# Patient Record
Sex: Male | Born: 2000 | Race: White | Hispanic: No | Marital: Single | Smoking: Never smoker
Health system: Southern US, Community
[De-identification: ages and names within clinical notes are randomized; demographics above are authoritative.]

## PROBLEM LIST (undated history)

## (undated) DIAGNOSIS — F909 Attention-deficit hyperactivity disorder, unspecified type: Secondary | ICD-10-CM

## (undated) DIAGNOSIS — E7800 Pure hypercholesterolemia, unspecified: Secondary | ICD-10-CM

---
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
## 2020-05-09 ENCOUNTER — Other Ambulatory Visit: Payer: Self-pay

## 2020-05-09 DIAGNOSIS — Z20822 Contact with and (suspected) exposure to covid-19: Secondary | ICD-10-CM

## 2020-05-10 LAB — NOVEL CORONAVIRUS, NAA: SARS-CoV-2, NAA: NOT DETECTED

## 2020-05-10 LAB — SARS-COV-2, NAA 2 DAY TAT

## 2020-08-28 ENCOUNTER — Other Ambulatory Visit: Payer: Self-pay

## 2020-08-28 ENCOUNTER — Encounter: Payer: Self-pay | Admitting: Emergency Medicine

## 2020-08-28 ENCOUNTER — Ambulatory Visit
Admission: EM | Admit: 2020-08-28 | Discharge: 2020-08-28 | Disposition: A | Discharge status: Home / Self Care | Payer: BC Managed Care – PPO | Attending: Family Medicine | Admitting: Family Medicine

## 2020-08-28 DIAGNOSIS — J069 Acute upper respiratory infection, unspecified: Secondary | ICD-10-CM | POA: Diagnosis not present

## 2020-08-28 DIAGNOSIS — Z20822 Contact with and (suspected) exposure to covid-19: Secondary | ICD-10-CM | POA: Insufficient documentation

## 2020-08-28 DIAGNOSIS — R051 Acute cough: Secondary | ICD-10-CM | POA: Insufficient documentation

## 2020-08-28 DIAGNOSIS — R519 Headache, unspecified: Secondary | ICD-10-CM | POA: Diagnosis present

## 2020-08-28 MED ORDER — PROMETHAZINE-DM 6.25-15 MG/5ML PO SYRP: 5.0000 mL | ORAL_SOLUTION | Freq: Four times a day (QID) | ORAL | 0 refills | Status: DC | PRN

## 2020-08-28 MED ORDER — BENZONATATE 100 MG PO CAPS: 200.0000 mg | ORAL_CAPSULE | Freq: Three times a day (TID) | ORAL | 0 refills | Status: DC

## 2020-08-28 NOTE — ED Triage Notes (Signed)
Pt presents with headache, cough, wheezing, body aches x 1-2 weeks.

## 2020-08-28 NOTE — ED Provider Notes (Signed)
[facility]    CSN: [id number] Arrival date & time: 08/28/20  1901      History   Chief Complaint Chief Complaint  Patient presents with  . Headache  . Cough  . Fever    HPI Jeremy Garrett is a 20 y.o. male.   HPI   20 year old male here for evaluation of headache, fatigue, cough, shortness of breath, wheezing, runny nose, nasal congestion, diarrhea, body aches that started 1 week ago.  Patient denies ear pain, sore throat, nausea or vomiting.  Patient stop infection against Covid or flu.  History reviewed. No pertinent past medical history.  There are no problems to display for this patient.   History reviewed. No pertinent surgical history.     Home Medications    Prior to Admission medications   Medication Sig Start Date End Date Taking? Authorizing Provider  benzonatate (TESSALON) 100 MG capsule Take 2 capsules (200 mg total) by mouth every 8 (eight) hours. 08/28/20  Yes Becky Augusta, NP  promethazine-dextromethorphan (PROMETHAZINE-DM) 6.25-15 MG/5ML syrup Take 5 mLs by mouth 4 (four) times daily as needed. 08/28/20  Yes Becky Augusta, NP  hydrOXYzine (ATARAX/VISTARIL) 25 MG tablet Take 25 mg by mouth 3 (three) times daily. 08/14/20   [provider]  albuterol (PROVENTIL HFA;VENTOLIN HFA) 108 (90 BASE) MCG/ACT inhaler Inhale 2 puffs into the lungs every 6 (six) hours as needed for wheezing or shortness of breath. 05/22/15 08/28/20  Menshew, Charlesetta Ivory, PA-C    Family History History reviewed. No pertinent family history.  Social History Social History   Tobacco Use  . Smoking status: Never Smoker  . Smokeless tobacco: Current User  Substance Use Topics  . Alcohol use: No     Allergies   Patient has no known allergies.   Review of Systems Review of Systems  Constitutional: Positive for fatigue and fever. Negative for activity change and appetite change.  HENT: Positive for congestion and rhinorrhea. Negative for ear pain  and sore throat.   Respiratory: Positive for cough, shortness of breath and wheezing.   Gastrointestinal: Positive for diarrhea. Negative for nausea and vomiting.  Musculoskeletal: Positive for arthralgias and myalgias.  Neurological: Positive for headaches.  Hematological: Negative.   Psychiatric/Behavioral: Negative.      Physical Exam Triage Vital Signs ED Triage Vitals  Enc Vitals Group     BP      Pulse      Resp      Temp      Temp src      SpO2      Weight      Height      Head Circumference      Peak Flow      Pain Score      Pain Loc      Pain Edu?      Excl. in GC?    No data found.  Updated Vital Signs BP 124/64 (BP Location: Right Arm)   Pulse 68   Temp 98.2 F (36.8 C) (Oral)   Resp 16   Ht 5\' 10"  (1.778 m)   Wt 166 lb (75.3 kg)   SpO2 100%   BMI 23.82 kg/m   Visual Acuity Right Eye Distance:   Left Eye Distance:   Bilateral Distance:    Right Eye Near:   Left Eye Near:    Bilateral Near:     Physical Exam Vitals and nursing note reviewed.  Constitutional:      General: He  is not in acute distress.    Appearance: Normal appearance. He is well-developed and normal weight. He is not ill-appearing.  HENT:     Head: Normocephalic and atraumatic.     Right Ear: Tympanic membrane, ear canal and external ear normal.     Left Ear: Tympanic membrane, ear canal and external ear normal.     Nose: Congestion and rhinorrhea present.     Comments: Nasal mucosa is erythematous and edematous with scant clear nasal discharge.    Mouth/Throat:     Mouth: Mucous membranes are moist.     Pharynx: Oropharynx is clear. No posterior oropharyngeal erythema.  Cardiovascular:     Rate and Rhythm: Normal rate and regular rhythm.     Pulses: Normal pulses.     Heart sounds: Normal heart sounds. No murmur heard. No gallop.   Pulmonary:     Effort: Pulmonary effort is normal.     Breath sounds: Normal breath sounds. No wheezing, rhonchi or rales.   Musculoskeletal:     Cervical back: Normal range of motion and neck supple.  Lymphadenopathy:     Cervical: No cervical adenopathy.  Skin:    General: Skin is warm and dry.     Capillary Refill: Capillary refill takes less than 2 seconds.     Findings: No erythema.  Neurological:     General: No focal deficit present.     Mental Status: He is alert.  Psychiatric:        Mood and Affect: Mood normal.        Behavior: Behavior normal.        Thought Content: Thought content normal.        Judgment: Judgment normal.      UC Treatments / Results  Labs (all labs ordered are listed, but only abnormal results are displayed) Labs Reviewed  SARS CORONAVIRUS 2 (TAT 6-24 HRS)    EKG   Radiology No results found.  Procedures Procedures (including critical care time)  Medications Ordered in UC Medications - No data to display  Initial Impression / Assessment and Plan / UC Course  I have reviewed the triage vital signs and the nursing notes.  Pertinent labs & imaging results that were available during my care of the patient were reviewed by me and considered in my medical decision making (see chart for details).   For evaluation of Covid-like symptoms that have been going on for the past week.  Patient is in no acute distress.  Patient is unvaccinated his Covid and one of his close workmates who he takes breaks with recently tested positive for Covid.  Will send Covid swab and DC patient home to isolate pending the results.  Will give Tessalon Perles and Promethazine DM for cough and nasal congestion.   Final Clinical Impressions(s) / UC Diagnoses   Final diagnoses:  Viral URI with cough     Discharge Instructions     Isolate at home until the results of your Covid test are known.  If your test is positive you will have to quarantine for 5 days from the onset of your symptoms.  After 5 days you can break quarantine if your symptoms have improved and you have not run a  fever for 24 hours.  Take over-the-counter Tylenol and ibuprofen as needed for fever and body aches.  Use the Tessalon Perles during the day as needed for cough and the Promethazine DM at nighttime as needed for cough and congestion.  If you develop  worsening shortness of breath-especially at rest, you cannot speak in full sentences, or as a late sign your lips are turning blue you need to go to the ER for evaluation.    ED Prescriptions    Medication Sig Dispense Auth. Provider   benzonatate (TESSALON) 100 MG capsule Take 2 capsules (200 mg total) by mouth every 8 (eight) hours. 21 capsule Becky Augusta, NP   promethazine-dextromethorphan (PROMETHAZINE-DM) 6.25-15 MG/5ML syrup Take 5 mLs by mouth 4 (four) times daily as needed. 118 mL Becky Augusta, NP     PDMP not reviewed this encounter.   Becky Augusta, NP 08/28/20 1934

## 2020-08-28 NOTE — Discharge Instructions (Addendum)
Isolate at home until the results of your Covid test are known.  If your test is positive you will have to quarantine for 5 days from the onset of your symptoms.  After 5 days you can break quarantine if your symptoms have improved and you have not run a fever for 24 hours.  Take over-the-counter Tylenol and ibuprofen as needed for fever and body aches.  Use the Tessalon Perles during the day as needed for cough and the Promethazine DM at nighttime as needed for cough and congestion.  If you develop worsening shortness of breath-especially at rest, you cannot speak in full sentences, or as a late sign your lips are turning blue you need to go to the ER for evaluation.

## 2020-08-29 LAB — SARS CORONAVIRUS 2 (TAT 6-24 HRS): SARS Coronavirus 2: NEGATIVE

## 2020-09-12 ENCOUNTER — Ambulatory Visit
Admission: EM | Admit: 2020-09-12 | Discharge: 2020-09-12 | Disposition: A | Discharge status: Home / Self Care | Payer: BC Managed Care – PPO | Attending: Emergency Medicine | Admitting: Emergency Medicine

## 2020-09-12 ENCOUNTER — Other Ambulatory Visit: Payer: Self-pay

## 2020-09-12 DIAGNOSIS — J029 Acute pharyngitis, unspecified: Secondary | ICD-10-CM | POA: Insufficient documentation

## 2020-09-12 LAB — GROUP A STREP BY PCR: Group A Strep by PCR: NOT DETECTED

## 2020-09-12 NOTE — ED Provider Notes (Signed)
[facility]    CSN: [id number] Arrival date & time: 09/12/20  1902      History   Chief Complaint Chief Complaint  Patient presents with  . Sore Throat    HPI ROSHON Garrett is a 20 y.o. male.   HPI   20 year old male here for evaluation of sore throat.  Patient reports that he has had a sore throat for the past 3 days.  Patient denies any associated symptoms such as fever, runny nose, ear pain or pressure, or cough.  History reviewed. No pertinent past medical history.  There are no problems to display for this patient.   History reviewed. No pertinent surgical history.     Home Medications    Prior to Admission medications   Medication Sig Start Date End Date Taking? Authorizing Provider  hydrOXYzine (ATARAX/VISTARIL) 25 MG tablet Take 25 mg by mouth 3 (three) times daily. 08/14/20  Yes [provider]  benzonatate (TESSALON) 100 MG capsule Take 2 capsules (200 mg total) by mouth every 8 (eight) hours. 08/28/20   Becky Augusta, NP  promethazine-dextromethorphan (PROMETHAZINE-DM) 6.25-15 MG/5ML syrup Take 5 mLs by mouth 4 (four) times daily as needed. Patient taking differently: Take 5 mLs by mouth 4 (four) times daily as needed. 08/28/20   Becky Augusta, NP  albuterol (PROVENTIL HFA;VENTOLIN HFA) 108 (90 BASE) MCG/ACT inhaler Inhale 2 puffs into the lungs every 6 (six) hours as needed for wheezing or shortness of breath. 05/22/15 08/28/20  Menshew, Charlesetta Ivory, PA-C    Family History History reviewed. No pertinent family history.  Social History Social History   Tobacco Use  . Smoking status: Never Smoker  . Smokeless tobacco: Current User  Substance Use Topics  . Alcohol use: No     Allergies   Patient has no known allergies.   Review of Systems Review of Systems  Constitutional: Negative for activity change, appetite change and fever.  HENT: Positive for sore throat. Negative for congestion, ear pain and rhinorrhea.    Respiratory: Negative for cough and shortness of breath.   Gastrointestinal: Negative for nausea and vomiting.  Musculoskeletal: Negative for arthralgias and myalgias.  Skin: Negative for rash.  Hematological: Negative.   Psychiatric/Behavioral: Negative.      Physical Exam Triage Vital Signs ED Triage Vitals  Enc Vitals Group     BP      Pulse      Resp      Temp      Temp src      SpO2      Weight      Height      Head Circumference      Peak Flow      Pain Score      Pain Loc      Pain Edu?      Excl. in GC?    No data found.  Updated Vital Signs BP 124/63 (BP Location: Left Arm)   Pulse 73   Temp 98.5 F (36.9 C) (Oral)   Resp 17   Ht 5\' 10"  (1.778 m)   Wt 167 lb (75.8 kg)   SpO2 100%   BMI 23.96 kg/m   Visual Acuity Right Eye Distance:   Left Eye Distance:   Bilateral Distance:    Right Eye Near:   Left Eye Near:    Bilateral Near:     Physical Exam Vitals and nursing note reviewed.  Constitutional:      General: He is not in  acute distress.    Appearance: He is well-developed and normal weight. He is not ill-appearing.  HENT:     Head: Normocephalic and atraumatic.     Mouth/Throat:     Mouth: Mucous membranes are moist.     Pharynx: Oropharynx is clear. No oropharyngeal exudate or posterior oropharyngeal erythema.     Tonsils: No tonsillar exudate. 1+ on the right. 1+ on the left.  Cardiovascular:     Rate and Rhythm: Normal rate and regular rhythm.     Heart sounds: Normal heart sounds. No murmur heard. No gallop.   Pulmonary:     Effort: Pulmonary effort is normal.     Breath sounds: Normal breath sounds. No wheezing, rhonchi or rales.  Musculoskeletal:     Cervical back: Normal range of motion and neck supple.  Lymphadenopathy:     Cervical: No cervical adenopathy.  Skin:    General: Skin is warm and dry.     Capillary Refill: Capillary refill takes less than 2 seconds.     Findings: No erythema or rash.  Neurological:      General: No focal deficit present.     Mental Status: He is alert and oriented to person, place, and time.  Psychiatric:        Mood and Affect: Mood normal.        Behavior: Behavior normal.      UC Treatments / Results  Labs (all labs ordered are listed, but only abnormal results are displayed) Labs Reviewed  GROUP A STREP BY PCR    EKG   Radiology No results found.  Procedures Procedures (including critical care time)  Medications Ordered in UC Medications - No data to display  Initial Impression / Assessment and Plan / UC Course  I have reviewed the triage vital signs and the nursing notes.  Pertinent labs & imaging results that were available during my care of the patient were reviewed by me and considered in my medical decision making (see chart for details).   Patient is a nontoxic-appearing 20 year old male here for evaluation of a sore throat this been on for the past 3 days.  Physical exam reveals no anterior cervical lymphadenopathy.  Posterior oropharynx demonstrates no erythema, no edema of tonsillar pillars, and no exudate.  Lungs are clear to auscultation in all fields.  Will send strep PCR.  Strep PCR is negative.  We will discharge patient home with a diagnosis of pharyngitis and treat with supportive therapy.   Final Clinical Impressions(s) / UC Diagnoses   Final diagnoses:  Pharyngitis, unspecified etiology     Discharge Instructions     Your testing today did not reveal the presence of strep.  Your sore throat may be the result of a viral infection.  Use over-the-counter Tylenol and ibuprofen as needed for pain.  Gargle with warm salt water 2-3 times a day to soothe the tissues and aid in pain relief.  Return for reevaluation, or see your primary care provider, for any new or worsening symptoms.    ED Prescriptions    None     PDMP not reviewed this encounter.   Becky Augusta, NP 09/12/20 [phone]

## 2020-09-12 NOTE — ED Triage Notes (Signed)
Patient states that he has been having a sore throat x 3 days. States that he felt like it worsened today.

## 2020-09-12 NOTE — Discharge Instructions (Addendum)
Your testing today did not reveal the presence of strep.  Your sore throat may be the result of a viral infection.  Use over-the-counter Tylenol and ibuprofen as needed for pain.  Gargle with warm salt water 2-3 times a day to soothe the tissues and aid in pain relief.  Return for reevaluation, or see your primary care provider, for any new or worsening symptoms.

## 2020-10-30 ENCOUNTER — Other Ambulatory Visit: Payer: Self-pay

## 2020-10-30 ENCOUNTER — Encounter: Payer: Self-pay | Admitting: Emergency Medicine

## 2020-10-30 ENCOUNTER — Ambulatory Visit
Admission: EM | Admit: 2020-10-30 | Discharge: 2020-10-30 | Disposition: A | Discharge status: Home / Self Care | Payer: BC Managed Care – PPO | Attending: Physician Assistant | Admitting: Physician Assistant

## 2020-10-30 DIAGNOSIS — M542 Cervicalgia: Secondary | ICD-10-CM

## 2020-10-30 DIAGNOSIS — G44209 Tension-type headache, unspecified, not intractable: Secondary | ICD-10-CM

## 2020-10-30 DIAGNOSIS — M25512 Pain in left shoulder: Secondary | ICD-10-CM | POA: Diagnosis not present

## 2020-10-30 HISTORY — DX: Attention-deficit hyperactivity disorder, unspecified type: F90.9

## 2020-10-30 HISTORY — DX: Pure hypercholesterolemia, unspecified: E78.00

## 2020-10-30 MED ORDER — DICLOFENAC SODIUM 75 MG PO TBEC: 75.0000 mg | DELAYED_RELEASE_TABLET | Freq: Two times a day (BID) | ORAL | 0 refills | Status: DC

## 2020-10-30 MED ORDER — CYCLOBENZAPRINE HCL 10 MG PO TABS: 10.0000 mg | ORAL_TABLET | Freq: Three times a day (TID) | ORAL | 0 refills | Status: AC | PRN

## 2020-10-30 NOTE — Discharge Instructions (Addendum)
NECK PAIN: Stressed avoiding painful activities. This can exacerbate your symptoms and make them worse.  May apply heat to the areas of pain for some relief. Use medications as directed. Be aware of which medications make you drowsy and do not drive or operate any kind of heavy machinery while using the medication (ie pain medications or muscle relaxers). F/U with PCP for reexamination or return sooner if condition worsens or does not begin to improve over the next few days.   NECK PAIN RED FLAGS: If symptoms get worse than they are right now, you should come back sooner for re-evaluation. If you have increased numbness/ tingling or notice that the numbness/tingling is affecting the legs or saddle region, go to ER. If you ever lose continence go to ER.      Contact your primary care provider for an appointment.  If the shoulder pain continues you will need a referral to physical therapy and possibly also orthopedics.

## 2020-10-30 NOTE — ED Provider Notes (Signed)
[facility]    CSN: [id number] Arrival date & time: 10/30/20  1909      History   Chief Complaint Chief Complaint  Patient presents with  . Shoulder Pain  . Back Pain  . Headache    HPI Jeremy Garrett is a 20 y.o. male presenting for headaches and neck pain x 3 days.  Patient denies any injury.  He says he does have a little bit decreased range of motion turns his neck to the left.  Admits to some painful range of motion of the neck.  Pain does radiate a little bit to the left shoulder but not down the arm.  No associated numbness, weakness or tingling.  Denies any URI symptoms.  No fever, fatigue, cough, congestion or sore throat.  Patient says he has had worse headaches.  He denies any red flag signs or symptoms including dizziness, vision changes, nausea/vomiting, gait disturbances or confusion.  Has taken [facility] powder with slight improvement in the headache.  Admits to left shoulder pain x 1 month. No injury.  Patient says that the left shoulder pain is intermittent and all throughout the shoulder.  Denies any weakness of the left arm.  He is right-handed.  He says he takes [facility] powder whenever the pain flares up and that normally helps.  Patient is not been evaluated for his left shoulder pain.  No other concerns.  HPI  Past Medical History:  Diagnosis Date  . ADHD   . Hypercholesterolemia     There are no problems to display for this patient.   History reviewed. No pertinent surgical history.     Home Medications    Prior to Admission medications   Medication Sig Start Date End Date Taking? Authorizing Provider  cyclobenzaprine (FLEXERIL) 10 MG tablet Take 1 tablet (10 mg total) by mouth 3 (three) times daily as needed for up to 10 days for muscle spasms. 10/30/20 11/09/20 Yes Shirlee Latch, PA-C  diclofenac (VOLTAREN) 75 MG EC tablet Take 1 tablet (75 mg total) by mouth 2 (two) times daily for 15 days. 10/30/20 11/14/20 Yes Eusebio Friendly B, PA-C   hydrOXYzine (ATARAX/VISTARIL) 25 MG tablet Take 25 mg by mouth 3 (three) times daily. 08/14/20   [provider]  albuterol (PROVENTIL HFA;VENTOLIN HFA) 108 (90 BASE) MCG/ACT inhaler Inhale 2 puffs into the lungs every 6 (six) hours as needed for wheezing or shortness of breath. 05/22/15 08/28/20  Menshew, Charlesetta Ivory, PA-C    Family History Family History  Problem Relation Age of Onset  . Healthy Mother   . Healthy Father     Social History Social History   Tobacco Use  . Smoking status: Never Smoker  . Smokeless tobacco: Current User  Vaping Use  . Vaping Use: Never used  Substance Use Topics  . Alcohol use: No  . Drug use: Never     Allergies   Patient has no known allergies.   Review of Systems Review of Systems  Constitutional: Negative for fatigue and fever.  HENT: Negative for congestion, sore throat and trouble swallowing.   Eyes: Negative for photophobia and visual disturbance.  Respiratory: Negative for cough and shortness of breath.   Cardiovascular: Negative for chest pain.  Gastrointestinal: Negative for nausea and vomiting.  Musculoskeletal: Positive for arthralgias, neck pain and neck stiffness. Negative for back pain.  Skin: Negative for rash and wound.  Neurological: Negative for dizziness, weakness, light-headedness, numbness and headaches.  Hematological: Negative for adenopathy.  Physical Exam Triage Vital Signs ED Triage Vitals  Enc Vitals Group     BP 10/30/20 1925 120/60     Pulse Rate 10/30/20 1925 91     Resp 10/30/20 1925 18     Temp 10/30/20 1925 98.6 F (37 C)     Temp Source 10/30/20 1925 Oral     SpO2 10/30/20 1925 100 %     Weight 10/30/20 1924 160 lb (72.6 kg)     Height 10/30/20 1924 5\' 10"  (1.778 m)     Head Circumference --      Peak Flow --      Pain Score 10/30/20 1924 4     Pain Loc --      Pain Edu? --      Excl. in GC? --    No data found.  Updated Vital Signs BP 120/60 (BP Location: Right Arm)    Pulse 91   Temp 98.6 F (37 C) (Oral)   Resp 18   Ht 5\' 10"  (1.778 m)   Wt 160 lb (72.6 kg)   SpO2 100%   BMI 22.96 kg/m    Physical Exam Vitals and nursing note reviewed.  Constitutional:      General: He is not in acute distress.    Appearance: Normal appearance. He is well-developed. He is not ill-appearing.  HENT:     Head: Normocephalic and atraumatic.  Eyes:     General: No scleral icterus.    Conjunctiva/sclera: Conjunctivae normal.  Cardiovascular:     Rate and Rhythm: Normal rate and regular rhythm.     Heart sounds: Normal heart sounds.  Pulmonary:     Effort: Pulmonary effort is normal. No respiratory distress.     Breath sounds: Normal breath sounds. No wheezing, rhonchi or rales.  Chest:     Chest wall: Tenderness (mild TTP left pectoralis muscle) present.  Musculoskeletal:     Left shoulder: Tenderness (TTP anterior shoulder and lateral deltoid. No bony tenderness. Full ROM without pain. No TTP at elbow, wrist or forearm) present. No bony tenderness. Normal range of motion. Normal strength.     Cervical back: Neck supple. Tenderness (TTP bilateral paracervical muscles, L>R) present. No bony tenderness. Pain with movement present. Decreased range of motion (decreased ROM with rotation to left).  Skin:    General: Skin is warm and dry.  Neurological:     General: No focal deficit present.     Mental Status: He is alert. Mental status is at baseline.     Motor: No weakness.     Gait: Gait normal.  Psychiatric:        Mood and Affect: Mood normal.        Behavior: Behavior normal.        Thought Content: Thought content normal.      UC Treatments / Results  Labs (all labs ordered are listed, but only abnormal results are displayed) Labs Reviewed - No data to display  EKG   Radiology No results found.  Procedures Procedures (including critical care time)  Medications Ordered in UC Medications - No data to display  Initial Impression /  Assessment and Plan / UC Course  I have reviewed the triage vital signs and the nursing notes.  Pertinent labs & imaging results that were available during my care of the patient were reviewed by me and considered in my medical decision making (see chart for details).   19 year old male presenting for 3-day history of headache and neck pain  as well as 1 month history of left shoulder pain.  No injuries at all.  No red flag signs or symptoms noted and clinical history or HPI.  All vital signs are normal and stable.  He is overall well-appearing.  Admitting to minimal pain currently.  Treating conservatively at this time with diclofenac and cyclobenzaprine.  Advised stretches, ice and heat.  Advised to follow-up with PCP at next available appointment for reexamination.  If still having the symptoms that he should be referred to Ortho and/or physical therapy, especially for the shoulder.  Reviewed ED red flag signs and symptoms for headaches and neck pain and advised to follow-up with any of those occur.  Follow-up with [place] as needed.   Final Clinical Impressions(s) / UC Diagnoses   Final diagnoses:  Tension headache  Neck pain  Acute pain of left shoulder     Discharge Instructions     NECK PAIN: Stressed avoiding painful activities. This can exacerbate your symptoms and make them worse.  May apply heat to the areas of pain for some relief. Use medications as directed. Be aware of which medications make you drowsy and do not drive or operate any kind of heavy machinery while using the medication (ie pain medications or muscle relaxers). F/U with PCP for reexamination or return sooner if condition worsens or does not begin to improve over the next few days.   NECK PAIN RED FLAGS: If symptoms get worse than they are right now, you should come back sooner for re-evaluation. If you have increased numbness/ tingling or notice that the numbness/tingling is affecting the legs or saddle region, go to  ER. If you ever lose continence go to ER.      Contact your primary care provider for an appointment.  If the shoulder pain continues you will need a referral to physical therapy and possibly also orthopedics.   ED Prescriptions    Medication Sig Dispense Auth. Provider   diclofenac (VOLTAREN) 75 MG EC tablet Take 1 tablet (75 mg total) by mouth 2 (two) times daily for 15 days. 30 tablet Eusebio Friendly B, PA-C   cyclobenzaprine (FLEXERIL) 10 MG tablet Take 1 tablet (10 mg total) by mouth 3 (three) times daily as needed for up to 10 days for muscle spasms. 30 tablet Gareth Morgan     PDMP not reviewed this encounter.   Shirlee Latch, PA-C 10/31/20 [phone]

## 2020-10-30 NOTE — ED Triage Notes (Signed)
Patient c/o headache in the back of his head that started 3 days ago. He also reports left shoulder pain that started 1 month ago. He also thinks he pulled a muscle on both sides of his back that started on Friday when he was working out.

## 2020-11-10 ENCOUNTER — Other Ambulatory Visit: Payer: Self-pay

## 2020-11-10 ENCOUNTER — Ambulatory Visit
Admission: EM | Admit: 2020-11-10 | Discharge: 2020-11-10 | Disposition: A | Discharge status: Home / Self Care | Payer: BC Managed Care – PPO | Attending: Emergency Medicine | Admitting: Emergency Medicine

## 2020-11-10 DIAGNOSIS — K529 Noninfective gastroenteritis and colitis, unspecified: Secondary | ICD-10-CM

## 2020-11-10 MED ORDER — ONDANSETRON 8 MG PO TBDP: 8.0000 mg | ORAL_TABLET | Freq: Three times a day (TID) | ORAL | 0 refills | Status: DC | PRN

## 2020-11-10 NOTE — ED Triage Notes (Signed)
Patient states that he started having vomiting and diarrhea with abdominal pain since last night. States that he has been able to keep down some green tea. Last emesis at 945am.

## 2020-11-10 NOTE — ED Provider Notes (Signed)
[facility]    CSN: [id number] Arrival date & time: 11/10/20  1153      History   Chief Complaint Chief Complaint  Patient presents with  . Abdominal Pain    HPI Jeremy Garrett is a 20 y.o. male.   HPI   20 year old male here for evaluation of acute onset of nausea, vomiting diarrhea, and abdominal pain.  Patient reports that his symptoms started this morning.  He also reports that he learned this morning that one of his coworkers was out early in the week with similar symptoms.  Patient reports that his abdominal pain comes and goes and then it appears as an intense cramping right before he has a bout of diarrhea stool or a bout of emesis.  After he throws up or has diarrhea the pain dissipates quickly.  Patient states that he has had 3 episodes of diarrhea for large volumes of liquid stool without any blood in it, and 1 episode of liquid emesis without blood in it.  Patient denies any fever or eating any suspect foods.  Patient also reports has had intermittent dizziness but is not dizzy at present.  Past Medical History:  Diagnosis Date  . ADHD   . Hypercholesterolemia     There are no problems to display for this patient.   History reviewed. No pertinent surgical history.     Home Medications    Prior to Admission medications   Medication Sig Start Date End Date Taking? Authorizing Provider  ondansetron (ZOFRAN ODT) 8 MG disintegrating tablet Take 1 tablet (8 mg total) by mouth every 8 (eight) hours as needed for nausea or vomiting. 11/10/20  Yes Becky Augusta, NP  albuterol (PROVENTIL HFA;VENTOLIN HFA) 108 (90 BASE) MCG/ACT inhaler Inhale 2 puffs into the lungs every 6 (six) hours as needed for wheezing or shortness of breath. 05/22/15 08/28/20  Menshew, Charlesetta Ivory, PA-C    Family History Family History  Problem Relation Age of Onset  . Healthy Mother   . Healthy Father     Social History Social History   Tobacco Use  . Smoking status:  Never Smoker  . Smokeless tobacco: Current User  Vaping Use  . Vaping Use: Never used  Substance Use Topics  . Alcohol use: No  . Drug use: Never     Allergies   Patient has no known allergies.   Review of Systems Review of Systems  Constitutional: Negative for fever.  Gastrointestinal: Positive for diarrhea, nausea and vomiting. Negative for blood in stool.  Musculoskeletal: Negative.   Skin: Negative for rash.  Neurological: Positive for dizziness. Negative for syncope.  Hematological: Negative.   Psychiatric/Behavioral: Negative.      Physical Exam Triage Vital Signs ED Triage Vitals  Enc Vitals Group     BP 11/10/20 1238 (!) 109/55     Pulse Rate 11/10/20 1238 (!) 119     Resp 11/10/20 1238 18     Temp 11/10/20 1238 98.2 F (36.8 C)     Temp Source 11/10/20 1238 Oral     SpO2 11/10/20 1238 99 %     Weight 11/10/20 1237 162 lb (73.5 kg)     Height 11/10/20 1237 5\' 10"  (1.778 m)     Head Circumference --      Peak Flow --      Pain Score 11/10/20 1237 2     Pain Loc --      Pain Edu? --      Excl. in  GC? --    No data found.  Updated Vital Signs BP (!) 109/55 (BP Location: Right Arm)   Pulse (!) 119   Temp 98.2 F (36.8 C) (Oral)   Resp 18   Ht 5\' 10"  (1.778 m)   Wt 162 lb (73.5 kg)   SpO2 99%   BMI 23.24 kg/m   Visual Acuity Right Eye Distance:   Left Eye Distance:   Bilateral Distance:    Right Eye Near:   Left Eye Near:    Bilateral Near:     Physical Exam Vitals and nursing note reviewed.  Constitutional:      General: He is not in acute distress.    Appearance: He is well-developed. He is not ill-appearing.  HENT:     Head: Normocephalic and atraumatic.  Cardiovascular:     Rate and Rhythm: Normal rate and regular rhythm.     Heart sounds: Normal heart sounds. No murmur heard. No gallop.   Pulmonary:     Effort: Pulmonary effort is normal.     Breath sounds: Normal breath sounds. No wheezing, rhonchi or rales.  Abdominal:      General: Abdomen is flat. Bowel sounds are normal. There is no distension.     Palpations: Abdomen is soft. There is no hepatomegaly or splenomegaly.     Tenderness: There is abdominal tenderness in the epigastric area. There is no guarding or rebound.     Hernia: No hernia is present.  Skin:    General: Skin is warm and dry.     Capillary Refill: Capillary refill takes less than 2 seconds.     Findings: No erythema or rash.  Neurological:     General: No focal deficit present.     Mental Status: He is alert and oriented to person, place, and time.  Psychiatric:        Mood and Affect: Mood normal.        Behavior: Behavior normal.      UC Treatments / Results  Labs (all labs ordered are listed, but only abnormal results are displayed) Labs Reviewed - No data to display  EKG   Radiology No results found.  Procedures Procedures (including critical care time)  Medications Ordered in UC Medications - No data to display  Initial Impression / Assessment and Plan / UC Course  I have reviewed the triage vital signs and the nursing notes.  Pertinent labs & imaging results that were available during my care of the patient were reviewed by me and considered in my medical decision making (see chart for details).   Patient is a nontoxic-appearing 20 year old male here for evaluation of abdominal cramping with associated nausea, vomiting, and diarrhea that started this morning abruptly.  Patient reports that his last emesis was 0 9:45 AM and he has since been able to keep down some green tea.  Patient was sent here from work as that is where his symptoms started.  While at work he was also informed that one of his coworkers was out earlier in the week with similar symptoms.  Physical exam reveals normal skin turgor, moist sclera, and moist oral mucous membranes.  Cardiopulmonary exam is benign.  Abdomen is soft, flat, with positive bowel sounds in all 4 quadrants and mild epigastric  tenderness.  Suspect patient has viral gastroenteritis and will discharge home on Zofran to control nausea.  I have reviewed oral rehydration protocol with the patient and he verbalizes an understanding.   Final Clinical Impressions(s) /  UC Diagnoses   Final diagnoses:  Noninfectious gastroenteritis, unspecified type     Discharge Instructions     Use the Zofran every 8 hours as needed for nausea and vomiting.  Rehydrate by mouth using Pedialyte, broth, ginger ale, and water.  I would not use green tea as it contains caffeine and may dehydrate you further.  Do not use over-the-counter Imodium to stop the diarrhea as we want your body to expel the bacteria from your gut.  You can follow the food choices listed in your discharge instructions to help you with the diarrhea stool.  If you develop vomiting that has not been controlled by the Zofran, and you are unable to take any keep down fluids by mouth, you need to go to the ER for evaluation and IV hydration.    ED Prescriptions    Medication Sig Dispense Auth. Provider   ondansetron (ZOFRAN ODT) 8 MG disintegrating tablet Take 1 tablet (8 mg total) by mouth every 8 (eight) hours as needed for nausea or vomiting. 20 tablet Becky Augusta, NP     PDMP not reviewed this encounter.   Becky Augusta, NP 11/10/20 1328

## 2020-11-10 NOTE — Discharge Instructions (Addendum)
Use the Zofran every 8 hours as needed for nausea and vomiting.  Rehydrate by mouth using Pedialyte, broth, ginger ale, and water.  I would not use green tea as it contains caffeine and may dehydrate you further.  Do not use over-the-counter Imodium to stop the diarrhea as we want your body to expel the bacteria from your gut.  You can follow the food choices listed in your discharge instructions to help you with the diarrhea stool.  If you develop vomiting that has not been controlled by the Zofran, and you are unable to take any keep down fluids by mouth, you need to go to the ER for evaluation and IV hydration.

## 2020-11-15 ENCOUNTER — Other Ambulatory Visit: Payer: Self-pay

## 2020-11-15 ENCOUNTER — Ambulatory Visit
Admission: EM | Admit: 2020-11-15 | Discharge: 2020-11-15 | Disposition: A | Discharge status: Home / Self Care | Payer: BC Managed Care – PPO | Attending: Emergency Medicine | Admitting: Emergency Medicine

## 2020-11-15 ENCOUNTER — Encounter: Payer: Self-pay | Admitting: Emergency Medicine

## 2020-11-15 ENCOUNTER — Ambulatory Visit (INDEPENDENT_AMBULATORY_CARE_PROVIDER_SITE_OTHER): Payer: BC Managed Care – PPO

## 2020-11-15 DIAGNOSIS — R1012 Left upper quadrant pain: Secondary | ICD-10-CM | POA: Insufficient documentation

## 2020-11-15 LAB — URINALYSIS, COMPLETE (UACMP) WITH MICROSCOPIC
Bilirubin Urine: NEGATIVE
Glucose, UA: NEGATIVE mg/dL
Hgb urine dipstick: NEGATIVE
Ketones, ur: NEGATIVE mg/dL
Leukocytes,Ua: NEGATIVE
Nitrite: NEGATIVE
Protein, ur: NEGATIVE mg/dL
Specific Gravity, Urine: >1.03 — ABNORMAL HIGH (ref 1.005–1.030)
pH: 6 (ref 5.0–8.0)

## 2020-11-15 LAB — COMPREHENSIVE METABOLIC PANEL
ALT: 14 U/L (ref 0–44)
AST: 19 U/L (ref 15–41)
Albumin: 5 g/dL (ref 3.5–5.0)
Alkaline Phosphatase: 62 U/L (ref 38–126)
Anion gap: 6 (ref 5–15)
BUN: 15 mg/dL (ref 6–20)
CO2: 29 mmol/L (ref 22–32)
Calcium: 9.3 mg/dL (ref 8.9–10.3)
Chloride: 103 mmol/L (ref 98–111)
Creatinine, Ser: 0.89 mg/dL (ref 0.61–1.24)
GFR, Estimated: >60 mL/min (ref >60)
Glucose, Bld: 92 mg/dL (ref 70–99)
Potassium: 4.5 mmol/L (ref 3.5–5.1)
Sodium: 138 mmol/L (ref 135–145)
Total Bilirubin: 0.7 mg/dL (ref 0.3–1.2)
Total Protein: 8 g/dL (ref 6.5–8.1)

## 2020-11-15 LAB — CBC WITH DIFFERENTIAL/PLATELET
Abs Immature Granulocytes: 0.03 10*3/uL (ref 0.00–0.07)
Basophils Absolute: 0 10*3/uL (ref 0.0–0.1)
Basophils Relative: 0 %
Eosinophils Absolute: 0.2 10*3/uL (ref 0.0–0.5)
Eosinophils Relative: 2 %
HCT: 44 % (ref 39.0–52.0)
Hemoglobin: 15.2 g/dL (ref 13.0–17.0)
Immature Granulocytes: 0 %
Lymphocytes Relative: 22 %
Lymphs Abs: 2 10*3/uL (ref 0.7–4.0)
MCH: 27.8 pg (ref 26.0–34.0)
MCHC: 34.5 g/dL (ref 30.0–36.0)
MCV: 80.6 fL (ref 80.0–100.0)
Monocytes Absolute: 0.4 10*3/uL (ref 0.1–1.0)
Monocytes Relative: 5 %
Neutro Abs: 6.3 10*3/uL (ref 1.7–7.7)
Neutrophils Relative %: 71 %
Platelets: 146 10*3/uL — ABNORMAL LOW (ref 150–400)
RBC: 5.46 MIL/uL (ref 4.22–5.81)
RDW: 13.5 % (ref 11.5–15.5)
WBC: 9 10*3/uL (ref 4.0–10.5)
nRBC: 0 % (ref 0.0–0.2)

## 2020-11-15 MED ORDER — DICYCLOMINE HCL 20 MG PO TABS: 20.0000 mg | ORAL_TABLET | Freq: Two times a day (BID) | ORAL | 0 refills | Status: DC

## 2020-11-15 NOTE — ED Provider Notes (Signed)
[facility]    CSN: [id number] Arrival date & time: 11/15/20  1841      History   Chief Complaint Chief Complaint  Patient presents with  . Abdominal Pain    HPI Jeremy Garrett is a 20 y.o. male.   HPI   20 year old male here for evaluation of abdominal pain.  Patient was evaluated in this urgent care 5 days ago for nausea, vomiting, diarrhea, and abdominal cramping.  Patient was diagnosed with viral gastroenteritis and discharged home on Zofran which he did not fill.  He reports that 4 days ago he developed pain on the left side of his abdomen that has been pretty constant and has also been associated with nausea.  Patient reports that he is no longer having diarrhea but had a semiformed stool yesterday.  Patient also reports is the first stool he has had since Friday.  Additionally, patient reports that he has had some painful urination but denies urinary urgency, frequency, penile discharge, or testicular pain.  No hematuria.  Patient is also not had a fever.  Past Medical History:  Diagnosis Date  . ADHD   . Hypercholesterolemia     There are no problems to display for this patient.   History reviewed. No pertinent surgical history.     Home Medications    Prior to Admission medications   Medication Sig Start Date End Date Taking? Authorizing Provider  dicyclomine (BENTYL) 20 MG tablet Take 1 tablet (20 mg total) by mouth 2 (two) times daily. 11/15/20  Yes Becky Augusta, NP  ondansetron (ZOFRAN ODT) 8 MG disintegrating tablet Take 1 tablet (8 mg total) by mouth every 8 (eight) hours as needed for nausea or vomiting. 11/10/20   Becky Augusta, NP  albuterol (PROVENTIL HFA;VENTOLIN HFA) 108 (90 BASE) MCG/ACT inhaler Inhale 2 puffs into the lungs every 6 (six) hours as needed for wheezing or shortness of breath. 05/22/15 08/28/20  Menshew, Charlesetta Ivory, PA-C    Family History Family History  Problem Relation Age of Onset  . Healthy Mother   . Healthy  Father     Social History Social History   Tobacco Use  . Smoking status: Never Smoker  . Smokeless tobacco: Current User  Vaping Use  . Vaping Use: Never used  Substance Use Topics  . Alcohol use: No  . Drug use: Never     Allergies   Patient has no known allergies.   Review of Systems Review of Systems  Constitutional: Negative for activity change, appetite change and fever.  Gastrointestinal: Positive for abdominal pain and nausea. Negative for diarrhea and vomiting.  Genitourinary: Positive for dysuria. Negative for frequency, hematuria and urgency.  Skin: Negative for rash.  Hematological: Negative.   Psychiatric/Behavioral: Negative.      Physical Exam Triage Vital Signs ED Triage Vitals  Enc Vitals Group     BP 11/15/20 1852 121/74     Pulse Rate 11/15/20 1852 65     Resp 11/15/20 1852 16     Temp 11/15/20 1852 98.2 F (36.8 C)     Temp Source 11/15/20 1852 Oral     SpO2 11/15/20 1852 100 %     Weight 11/15/20 1850 162 lb (73.5 kg)     Height 11/15/20 1850 5\' 10"  (1.778 m)     Head Circumference --      Peak Flow --      Pain Score 11/15/20 1850 3     Pain Loc --  Pain Edu? --      Excl. in GC? --    No data found.  Updated Vital Signs BP 121/74 (BP Location: Right Arm)   Pulse 65   Temp 98.2 F (36.8 C) (Oral)   Resp 16   Ht 5\' 10"  (1.778 m)   Wt 162 lb (73.5 kg)   SpO2 100%   BMI 23.24 kg/m   Visual Acuity Right Eye Distance:   Left Eye Distance:   Bilateral Distance:    Right Eye Near:   Left Eye Near:    Bilateral Near:     Physical Exam Vitals and nursing note reviewed.  Constitutional:      General: He is not in acute distress.    Appearance: He is well-developed and normal weight.  HENT:     Head: Normocephalic and atraumatic.  Cardiovascular:     Rate and Rhythm: Normal rate and regular rhythm.     Heart sounds: Normal heart sounds. No murmur heard. No gallop.   Pulmonary:     Effort: Pulmonary effort is normal.      Breath sounds: Normal breath sounds. No wheezing, rhonchi or rales.  Abdominal:     General: Abdomen is flat. Bowel sounds are normal. There is no distension.     Palpations: Abdomen is soft. There is no hepatomegaly.     Tenderness: There is abdominal tenderness in the left upper quadrant. There is left CVA tenderness. There is no right CVA tenderness.     Hernia: No hernia is present.  Skin:    General: Skin is warm and dry.     Capillary Refill: Capillary refill takes less than 2 seconds.     Findings: No erythema or rash.  Neurological:     General: No focal deficit present.     Mental Status: He is alert and oriented to person, place, and time.  Psychiatric:        Mood and Affect: Mood normal.        Behavior: Behavior normal.      UC Treatments / Results  Labs (all labs ordered are listed, but only abnormal results are displayed) Labs Reviewed  CBC WITH DIFFERENTIAL/PLATELET - Abnormal; Notable for the following components:      Result Value   Platelets 146 (*)    All other components within normal limits  URINALYSIS, COMPLETE (UACMP) WITH MICROSCOPIC - Abnormal; Notable for the following components:   Specific Gravity, Urine >1.030 (*)    Bacteria, UA FEW (*)    All other components within normal limits  COMPREHENSIVE METABOLIC PANEL    EKG   Radiology DG Abdomen 1 View  Result Date: 11/15/2020 CLINICAL DATA:  Acute left flank pain. EXAM: ABDOMEN - 1 VIEW COMPARISON:  None. FINDINGS: The bowel gas pattern is normal. No radio-opaque calculi or other significant radiographic abnormality are seen. IMPRESSION: Negative. Electronically Signed   By: 11/17/2020 M.D.   On: 11/15/2020 19:28    Procedures Procedures (including critical care time)  Medications Ordered in UC Medications - No data to display  Initial Impression / Assessment and Plan / UC Course  I have reviewed the triage vital signs and the nursing notes.  Pertinent labs & imaging results  that were available during my care of the patient were reviewed by me and considered in my medical decision making (see chart for details).   Patient is a nontoxic-appearing 20 year old male here for evaluation of left-sided abdominal pain with left flank pain, nausea,  and painful urination.  Patient was evaluated 5 days ago and treated for viral gastroenteritis.  Patient states that the pain the left side of his abdomen started the day after he was evaluated in this urgent care.  Patient physical exam reveals a benign cardiopulmonary exam.  Patient does have reported left CVA tenderness to percussion on exam.  Abdomen is soft, nondistended, with positive bowel sounds in all 4 quadrants.  Patient does have left upper quadrant tenderness.  No tenderness elsewhere in the abdomen.  Given patient's nausea, left upper quadrant pain, painful urination, this could be urinary tract infection, renal stone, constipation, or continuation of his gastroenteritis.  Will obtain CBC, CMP, UA, and KUB to look for stone.  KUB independently evaluated by me.  Interpretation: There is no evidence of renal calculi on x-ray.  Patient is a nonobstructive bowel gas pattern and scattered stool throughout the colon.  Awaiting radiology overread.  Radiology overread is that the KUB is negative exam.  Urinalysis shows a specific gravity >1.030 and few bacteria but otherwise unremarkable.  CMP is unremarkable.  CBC shows a normal white count and H&H.  Platelets are 146.  Patient's last available blood work shows a platelet count of 152 from 05/22/2015.  Patient's lab work is negative for renal stone, urinary tract infection, constipation, or systemic infection.  Suspect patient may have some residual inflammation from his gastroenteritis.  Will discharge patient home on Bentyl to help with his pain And have him fill his Zofran for nausea, and if his symptoms worsen go to the ER for evaluation.   Final Clinical Impressions(s) /  UC Diagnoses   Final diagnoses:  Left upper quadrant abdominal pain     Discharge Instructions     Your blood work and x-rays today did not reveal the presence of any infection or kidney stone.  Your pain may be coming from residual colon inflammation from your viral intestinal illness last week.  Fill the Zofran and take it every 8 hours as needed for nausea.  Fill and take the Bentyl every 6 hours as needed for abdominal pain and cramping.  If you have an increase in your abdominal pain, nausea vomiting not taken care of by the Zofran, bloody stools, or fever go to the ER for evaluation.    ED Prescriptions    Medication Sig Dispense Auth. Provider   dicyclomine (BENTYL) 20 MG tablet Take 1 tablet (20 mg total) by mouth 2 (two) times daily. 20 tablet Becky Augusta, NP     PDMP not reviewed this encounter.   Becky Augusta, NP 11/15/20 2003

## 2020-11-15 NOTE — Discharge Instructions (Addendum)
Your blood work and x-rays today did not reveal the presence of any infection or kidney stone.  Your pain may be coming from residual colon inflammation from your viral intestinal illness last week.  Fill the Zofran and take it every 8 hours as needed for nausea.  Fill and take the Bentyl every 6 hours as needed for abdominal pain and cramping.  If you have an increase in your abdominal pain, nausea vomiting not taken care of by the Zofran, bloody stools, or fever go to the ER for evaluation.

## 2020-11-15 NOTE — ED Triage Notes (Signed)
Patient reports LQ abdominal pain that started on Monday.  Patient states that he was seen last Friday for GI symptoms.  Patient reports some nausea.  Patient states that his last bowel movement was yesterday and was semi form stools.

## 2020-12-06 ENCOUNTER — Ambulatory Visit
Admission: EM | Admit: 2020-12-06 | Discharge: 2020-12-06 | Disposition: A | Discharge status: Home / Self Care | Payer: BC Managed Care – PPO | Attending: Family Medicine | Admitting: Family Medicine

## 2020-12-06 ENCOUNTER — Other Ambulatory Visit: Payer: Self-pay

## 2020-12-06 DIAGNOSIS — R1013 Epigastric pain: Secondary | ICD-10-CM

## 2020-12-06 DIAGNOSIS — G44209 Tension-type headache, unspecified, not intractable: Secondary | ICD-10-CM | POA: Diagnosis not present

## 2020-12-06 MED ORDER — ONDANSETRON HCL 4 MG PO TABS: 4.0000 mg | ORAL_TABLET | Freq: Three times a day (TID) | ORAL | 0 refills | Status: AC | PRN

## 2020-12-06 MED ORDER — PANTOPRAZOLE SODIUM 40 MG PO TBEC: 40.0000 mg | DELAYED_RELEASE_TABLET | Freq: Every day | ORAL | 0 refills | Status: AC

## 2020-12-06 MED ORDER — BUTALBITAL-APAP-CAFFEINE 50-325-40 MG PO TABS: 1.0000 | ORAL_TABLET | Freq: Four times a day (QID) | ORAL | 0 refills | Status: AC | PRN

## 2020-12-06 NOTE — ED Provider Notes (Signed)
[facility]    CSN: [id number] Arrival date & time: 12/06/20  1758      History   Chief Complaint Chief Complaint  Patient presents with  . Headache  . Abdominal Pain   HPI  20 year old male presents with multiple complaints.  Patient reports that his symptoms started approximately 5 days ago.  He reports ongoing headache.  He localizes it to the occipital and frontal regions.  He reports that he has had some nausea.  No vomiting.  Additionally, patient reports ongoing abdominal pain which she localizes to the left upper quadrant and left flank.  No diarrhea.  No constipation.  Patient also reports that he has had some chest pain/pressure recently.  He states that he normally takes Goody powder for his headache but this has not resolved it.  His pain is 5/10 in severity.  No relieving factors.  No other complaints.  Past Medical History:  Diagnosis Date  . ADHD   . Hypercholesterolemia    Home Medications    Prior to Admission medications   Medication Sig Start Date End Date Taking? Authorizing Provider  butalbital-acetaminophen-caffeine (FIORICET) 50-325-40 MG tablet Take 1 tablet by mouth every 6 (six) hours as needed for headache. 12/06/20 12/06/21 Yes Kalianna Verbeke G, DO  ondansetron (ZOFRAN) 4 MG tablet Take 1 tablet (4 mg total) by mouth every 8 (eight) hours as needed for nausea or vomiting. 12/06/20  Yes Remy Voiles G, DO  pantoprazole (PROTONIX) 40 MG tablet Take 1 tablet (40 mg total) by mouth daily. 12/06/20  Yes Tee Richeson G, DO  albuterol (PROVENTIL HFA;VENTOLIN HFA) 108 (90 BASE) MCG/ACT inhaler Inhale 2 puffs into the lungs every 6 (six) hours as needed for wheezing or shortness of breath. 05/22/15 08/28/20  Menshew, Charlesetta Ivory, PA-C  dicyclomine (BENTYL) 20 MG tablet Take 1 tablet (20 mg total) by mouth 2 (two) times daily. 11/15/20 12/06/20  Becky Augusta, NP    Family History Family History  Problem Relation Age of Onset  . Healthy Mother   . Healthy  Father     Social History Social History   Tobacco Use  . Smoking status: Never Smoker  . Smokeless tobacco: Current User  Vaping Use  . Vaping Use: Never used  Substance Use Topics  . Alcohol use: No  . Drug use: Never     Allergies   Patient has no known allergies.   Review of Systems Review of Systems Per HPI  Physical Exam Triage Vital Signs ED Triage Vitals  Enc Vitals Group     BP 12/06/20 1808 108/70     Pulse Rate 12/06/20 1808 65     Resp 12/06/20 1808 19     Temp 12/06/20 1808 98.9 F (37.2 C)     Temp src --      SpO2 12/06/20 1808 100 %     Weight --      Height --      Head Circumference --      Peak Flow --      Pain Score 12/06/20 1807 5     Pain Loc --      Pain Edu? --      Excl. in GC? --    Updated Vital Signs BP 108/70   Pulse 65   Temp 98.9 F (37.2 C)   Resp 19   SpO2 100%   Visual Acuity Right Eye Distance:   Left Eye Distance:   Bilateral Distance:    Right Eye  Near:   Left Eye Near:    Bilateral Near:     Physical Exam Vitals and nursing note reviewed.  Constitutional:      General: He is not in acute distress.    Appearance: Normal appearance.  HENT:     Head: Normocephalic and atraumatic.  Eyes:     General:        Right eye: No discharge.        Left eye: No discharge.     Conjunctiva/sclera: Conjunctivae normal.  Cardiovascular:     Rate and Rhythm: Normal rate and regular rhythm.     Heart sounds: No murmur heard.   Pulmonary:     Effort: Pulmonary effort is normal.     Breath sounds: Normal breath sounds. No wheezing, rhonchi or rales.  Abdominal:     General: There is no distension.     Palpations: Abdomen is soft.     Comments: Epigastric tenderness to palpation.  Neurological:     Mental Status: He is alert.  Psychiatric:        Mood and Affect: Mood normal.        Behavior: Behavior normal.    UC Treatments / Results  Labs (all labs ordered are listed, but only abnormal results are  displayed) Labs Reviewed - No data to display  EKG Interpretation: Sinus bradycardia with rate of 58.  Normal axis.  Normal intervals.  Early repolarization noted.  Unremarkable EKG.  Radiology No results found.  Procedures Procedures (including critical care time)  Medications Ordered in UC Medications - No data to display  Initial Impression / Assessment and Plan / UC Course  I have reviewed the triage vital signs and the nursing notes.  Pertinent labs & imaging results that were available during my care of the patient were reviewed by me and considered in my medical decision making (see chart for details).    20 year old male presents with history consistent with tension headache.  Also having epigastric pain.  Placing on Protonix.  Zofran as needed.  Fioricet for headache.  Supportive care.  Work note given.  Final Clinical Impressions(s) / UC Diagnoses   Final diagnoses:  Tension headache  Abdominal pain, epigastric     Discharge Instructions     Be sure to stay hydrated.  Medication as directed.  Take care  Dr. Adriana Simas    ED Prescriptions    Medication Sig Dispense Auth. Provider   pantoprazole (PROTONIX) 40 MG tablet Take 1 tablet (40 mg total) by mouth daily. 30 tablet Tequlia Gonsalves G, DO   butalbital-acetaminophen-caffeine (FIORICET) 50-325-40 MG tablet Take 1 tablet by mouth every 6 (six) hours as needed for headache. 20 tablet Therron Sells G, DO   ondansetron (ZOFRAN) 4 MG tablet Take 1 tablet (4 mg total) by mouth every 8 (eight) hours as needed for nausea or vomiting. 20 tablet Tommie Sams, DO     PDMP not reviewed this encounter.   Tommie Sams, [place] 12/06/20 1918

## 2020-12-06 NOTE — ED Triage Notes (Addendum)
Pt presents with complaints of posterior headache, upper abdominal pain, and chest pressure x 5 days total. Pt also endorses feeling diaphoretic at times as well. Reports having chest pain since he was 10 off and on with negative work ups. Reports history of headaches that improve with medication, medication is not improving this one and is making him feel nauseous.

## 2020-12-06 NOTE — Discharge Instructions (Signed)
Be sure to stay hydrated.  Medication as directed.  Take care  Dr. Adriana Simas

## 2021-12-24 ENCOUNTER — Emergency Department: Payer: Self-pay

## 2021-12-24 ENCOUNTER — Emergency Department
Admission: EM | Admit: 2021-12-24 | Discharge: 2021-12-24 | Disposition: A | Discharge status: Home / Self Care | Payer: Self-pay | Attending: Emergency Medicine | Admitting: Emergency Medicine

## 2021-12-24 ENCOUNTER — Encounter: Payer: Self-pay | Admitting: Emergency Medicine

## 2021-12-24 DIAGNOSIS — S80811A Abrasion, right lower leg, initial encounter: Secondary | ICD-10-CM | POA: Insufficient documentation

## 2021-12-24 DIAGNOSIS — S43015A Anterior dislocation of left humerus, initial encounter: Secondary | ICD-10-CM | POA: Insufficient documentation

## 2021-12-24 DIAGNOSIS — S0081XA Abrasion of other part of head, initial encounter: Secondary | ICD-10-CM | POA: Insufficient documentation

## 2021-12-24 DIAGNOSIS — W010XXA Fall on same level from slipping, tripping and stumbling without subsequent striking against object, initial encounter: Secondary | ICD-10-CM | POA: Insufficient documentation

## 2021-12-24 DIAGNOSIS — Z23 Encounter for immunization: Secondary | ICD-10-CM | POA: Insufficient documentation

## 2021-12-24 MED ORDER — ONDANSETRON HCL 4 MG/2ML IJ SOLN
4.0000 mg | Freq: Once | INTRAMUSCULAR | Status: AC
  Administered 2021-12-24: 4 mg via INTRAVENOUS
  Filled 2021-12-24: qty 2

## 2021-12-24 MED ORDER — PROPOFOL 10 MG/ML IV BOLUS
1.0000 mg/kg | Freq: Once | INTRAVENOUS | Status: AC
  Administered 2021-12-24: 76.2 mg via INTRAVENOUS
  Filled 2021-12-24: qty 20

## 2021-12-24 MED ORDER — FENTANYL CITRATE PF 50 MCG/ML IJ SOSY
50.0000 ug | PREFILLED_SYRINGE | Freq: Once | INTRAMUSCULAR | Status: AC
  Administered 2021-12-24: 50 ug via INTRAVENOUS
  Filled 2021-12-24: qty 1

## 2021-12-24 MED ORDER — TETANUS-DIPHTH-ACELL PERTUSSIS 5-2.5-18.5 LF-MCG/0.5 IM SUSY
0.5000 mL | PREFILLED_SYRINGE | Freq: Once | INTRAMUSCULAR | Status: AC
  Administered 2021-12-24: 0.5 mL via INTRAMUSCULAR
  Filled 2021-12-24: qty 0.5

## 2021-12-24 MED ORDER — HYDROCODONE-ACETAMINOPHEN 5-325 MG PO TABS: 2.0000 | ORAL_TABLET | Freq: Four times a day (QID) | ORAL | 0 refills | Status: AC | PRN

## 2021-12-24 MED ORDER — HYDROMORPHONE HCL 1 MG/ML IJ SOLN
1.0000 mg | Freq: Once | INTRAMUSCULAR | Status: AC
  Administered 2021-12-24: 1 mg via INTRAVENOUS
  Filled 2021-12-24: qty 1

## 2021-12-24 MED ORDER — IBUPROFEN 800 MG PO TABS: 800.0000 mg | ORAL_TABLET | Freq: Three times a day (TID) | ORAL | 0 refills | Status: AC | PRN

## 2021-12-24 MED ORDER — ONDANSETRON 4 MG PO TBDP: 4.0000 mg | ORAL_TABLET | Freq: Four times a day (QID) | ORAL | 0 refills | Status: AC | PRN

## 2021-12-24 MED ORDER — SODIUM CHLORIDE 0.9 % IV BOLUS (SEPSIS)
1000.0000 mL | Freq: Once | INTRAVENOUS | Status: AC
  Administered 2021-12-24: 1000 mL via INTRAVENOUS

## 2021-12-24 NOTE — ED Notes (Signed)
Left shoulder reduced by Dr. Baxter Hire Ward.

## 2021-12-24 NOTE — ED Triage Notes (Signed)
Pt presents via EMS following a fall with an apparent deformity to his left shoulder. Denies hitting his head -LOC - no on thinners. CNS intact - cap refill <3 seconds.

## 2021-12-24 NOTE — ED Notes (Signed)
Patient reports continued numbness to left deltoid.

## 2021-12-24 NOTE — Discharge Instructions (Addendum)

## 2021-12-24 NOTE — ED Provider Notes (Signed)
[facility] Provider Note    Event Date/Time   First MD Initiated Contact with Patient 12/24/21 [phone]     (approximate)   History   Shoulder Injury   HPI  Jeremy Garrett is a 21 y.o. male right-hand-dominant male with history of ADHD, hyperlipidemia who presents to the emergency department after he slipped and fell onto his left arm.  Not able to move at the left shoulder due to pain.  No previous history of dislocation.  Also has an abrasion to the right shin.  Did not hit his head or lose consciousness.  Not on blood thinners.  N.p.o. since about 12:30 AM.   History provided by patient and significant other.    Past Medical History:  Diagnosis Date   ADHD    Hypercholesterolemia     History reviewed. No pertinent surgical history.  MEDICATIONS:  Prior to Admission medications   Medication Sig Start Date End Date Taking? Authorizing Provider  ondansetron (ZOFRAN) 4 MG tablet Take 1 tablet (4 mg total) by mouth every 8 (eight) hours as needed for nausea or vomiting. 12/06/20   Coral Spikes, DO  pantoprazole (PROTONIX) 40 MG tablet Take 1 tablet (40 mg total) by mouth daily. 12/06/20   Coral Spikes, DO  albuterol (PROVENTIL HFA;VENTOLIN HFA) 108 (90 BASE) MCG/ACT inhaler Inhale 2 puffs into the lungs every 6 (six) hours as needed for wheezing or shortness of breath. 05/22/15 08/28/20  Menshew, Dannielle Karvonen, PA-C  dicyclomine (BENTYL) 20 MG tablet Take 1 tablet (20 mg total) by mouth 2 (two) times daily. 11/15/20 12/06/20  Margarette Canada, NP    Physical Exam   Triage Vital Signs: ED Triage Vitals  Enc Vitals Group     BP 12/24/21 0238 139/81     Pulse Rate 12/24/21 0238 62     Resp 12/24/21 0238 20     Temp 12/24/21 0238 98 F (36.7 C)     Temp src --      SpO2 12/24/21 0238 99 %     Weight 12/24/21 0238 168 lb (76.2 kg)     Height 12/24/21 0238 5\' 10"  (1.778 m)     Head Circumference --      Peak Flow --      Pain Score 12/24/21 0238 10      Pain Loc --      Pain Edu? --      Excl. in [place]? --     Most recent vital signs: Vitals:   12/24/21 0425 12/24/21 0430  BP:  119/70  Pulse: (!) 51 (!) 51  Resp: 17 (!) 21  Temp:    SpO2: 99% 95%     CONSTITUTIONAL: Alert and oriented and responds appropriately to questions.  Peers uncomfortable, tearful HEAD: Normocephalic; atraumatic EYES: Conjunctivae clear, PERRL, EOMI ENT: normal nose; no rhinorrhea; moist mucous membranes; pharynx without lesions noted; no dental injury; no septal hematoma, no epistaxis; no facial deformity or bony tenderness NECK: Supple, no midline spinal tenderness, step-off or deformity; trachea midline CARD: RRR; S1 and S2 appreciated; no murmurs, no clicks, no rubs, no gallops RESP: Normal chest excursion without splinting or tachypnea; breath sounds clear and equal bilaterally; no wheezes, no rhonchi, no rales; no hypoxia or respiratory distress CHEST:  chest wall stable, no crepitus or ecchymosis or deformity, nontender to palpation; no flail chest ABD/GI: Normal bowel sounds; non-distended; soft, non-tender, no rebound, no guarding; no ecchymosis or other lesions noted PELVIS:  stable, nontender  to palpation BACK:  The back appears normal; no midline spinal tenderness, step-off or deformity EXT: Patient has loss of fullness of the left shoulder and unable to range of the left shoulder due to pain.  2+ left DP pulse.  He reports diminished sensation over the deltoid area/axillary nerve distribution.  Otherwise normal sensation.  Compartments soft.  Normal capillary refill. SKIN: Normal color for age and race; warm NEURO: No facial asymmetry, normal speech, moving all extremities equally  ED Results / Procedures / Treatments   LABS: (all labs ordered are listed, but only abnormal results are displayed) Labs Reviewed - No data to display   EKG:    RADIOLOGY: My personal review and interpretation of imaging: Initial x-ray shows anterior left  shoulder dislocation.  Repeat x-ray shows reduction.  I have personally reviewed all radiology reports. DG Shoulder Left  Result Date: 12/24/2021 CLINICAL DATA:  Fall and trauma to the left shoulder. EXAM: LEFT SHOULDER - 2+ VIEW COMPARISON:  None Available. FINDINGS: Anterior dislocation of the left shoulder. No definite acute fracture. The bones are well mineralized. The soft tissues are unremarkable. IMPRESSION: Anterior dislocation of the left shoulder. Electronically Signed   By: Anner Crete M.D.   On: 12/24/2021 03:28   DG Shoulder Left Portable  Result Date: 12/24/2021 CLINICAL DATA:  Dislocation reduction views. EXAM: LEFT SHOULDER COMPARISON:  Presented with anterior glenohumeral dislocation with comparison to the plain films today at 3:10 a.m. FINDINGS: Successful interval relocation of the humeral head to the glenoid fossa. There is a Hill-Sachs wedge impaction deformity of the superolateral humeral head. No fracture is evident. The [facility] joint is normal. IMPRESSION: Successful left shoulder dislocation reduction with Hill-Sachs deformity of the superolateral humeral head. Electronically Signed   By: Telford Nab M.D.   On: 12/24/2021 04:52     PROCEDURES:  Critical Care performed: Yes, see critical care procedure note(s)   CRITICAL CARE Performed by: Cyril Mourning Brinkley Peet   Total critical care time: 45 minutes  Critical care time was exclusive of separately billable procedures and treating other patients.  Critical care was necessary to treat or prevent imminent or life-threatening deterioration.  Critical care was time spent personally by me on the following activities: development of treatment plan with patient and/or surrogate as well as nursing, discussions with consultants, evaluation of patient's response to treatment, examination of patient, obtaining history from patient or surrogate, ordering and performing treatments and interventions, ordering and review of laboratory  studies, ordering and review of radiographic studies, pulse oximetry and re-evaluation of patient's condition.   Marland Kitchen1-3 Lead EKG Interpretation Performed by: Glenola Wheat, Delice Bison, DO Authorized by: Keedan Sample, Delice Bison, DO     Interpretation: normal     ECG rate:  55   ECG rate assessment: normal     Rhythm: sinus rhythm     Ectopy: none     Conduction: normal   .Sedation  Date/Time: 12/24/2021 4:11 AM Performed by: Shaundra Fullam, Delice Bison, DO Authorized by: Angelyn Osterberg, Delice Bison, DO   Consent:    Consent obtained:  Written   Consent given by:  Patient   Risks discussed:  Allergic reaction, dysrhythmia, inadequate sedation, nausea, vomiting, respiratory compromise necessitating ventilatory assistance and intubation, prolonged hypoxia resulting in organ damage and prolonged sedation necessitating reversal   Alternatives discussed:  Analgesia without sedation and regional anesthesia Universal protocol:    Procedure explained and questions answered to patient or proxy's satisfaction: yes     Relevant documents present and verified: yes  Test results available: yes     Imaging studies available: yes     Required blood products, implants, devices, and special equipment available: yes     Site/side marked: yes     Immediately prior to procedure, a time out was called: yes     Patient identity confirmed:  Verbally with patient Indications:    Procedure performed:  Dislocation reduction   Procedure necessitating sedation performed by:  Physician performing sedation Pre-sedation assessment:    Time since last food or drink:  12:30 AM   ASA classification: class 1 - normal, healthy patient     Mouth opening:  3 or more finger widths   Thyromental distance:  4 finger widths   Mallampati score:  I - soft palate, uvula, fauces, pillars visible   Neck mobility: normal     Pre-sedation assessments completed and reviewed: airway patency, cardiovascular function, hydration status, mental status, nausea/vomiting,  pain level, respiratory function and temperature     Pre-sedation assessment completed:  12/24/2021 3:40 AM Immediate pre-procedure details:    Reassessment: Patient reassessed immediately prior to procedure     Reviewed: vital signs, relevant labs/tests and NPO status     Verified: bag valve mask available, emergency equipment available, intubation equipment available, IV patency confirmed, oxygen available, reversal medications available and suction available   Procedure details (see MAR for exact dosages):    Preoxygenation:  Nasal cannula   Sedation:  Propofol   Intended level of sedation: deep   Analgesia:  Hydromorphone   Intra-procedure monitoring:  Blood pressure monitoring, cardiac monitor, continuous pulse oximetry, continuous capnometry, frequent LOC assessments and frequent vital sign checks   Intra-procedure events: none     Total Provider sedation time (minutes):  10 Post-procedure details:    Post-sedation assessment completed:  12/24/2021 4:39 AM   Attendance: Constant attendance by certified staff until patient recovered     Recovery: Patient returned to pre-procedure baseline     Post-sedation assessments completed and reviewed: airway patency, cardiovascular function, hydration status, mental status, nausea/vomiting, pain level, respiratory function and temperature     Patient is stable for discharge or admission: yes     Procedure completion:  Tolerated well, no immediate complications Reduction of dislocation  Date/Time: 12/24/2021 4:12 AM Performed by: Vernal Hritz, Layla Maw, DO Authorized by: [place] Espina, Layla Maw, DO  Consent: Written consent obtained. Risks and benefits: risks, benefits and alternatives were discussed Consent given by: patient Patient understanding: patient states understanding of the procedure being performed Patient consent: the patient's understanding of the procedure matches consent given Procedure consent: procedure consent matches procedure  scheduled Relevant documents: relevant documents present and verified Test results: test results available and properly labeled Site marked: the operative site was marked Imaging studies: imaging studies available Required items: required blood products, implants, devices, and special equipment available Patient identity confirmed: verbally with patient Time out: Immediately prior to procedure a "time out" was called to verify the correct patient, procedure, equipment, support staff and site/side marked as required. Preparation: Patient was prepped and draped in the usual sterile fashion. Local anesthesia used: no  Anesthesia: Local anesthesia used: no  Sedation: Patient sedated: yes Sedatives: propofol Analgesia: hydromorphone Sedation start date/time: 12/24/2021 3:55 AM Sedation end date/time: 12/24/2021 4:24 AM Vitals: Vital signs were monitored during sedation.  Patient tolerance: patient tolerated the procedure well with no immediate complications      IMPRESSION / MDM / ASSESSMENT AND PLAN / ED COURSE  I reviewed the triage vital signs and  the nursing notes.  Patient here with mechanical fall with left anterior shoulder dislocation with numbness over the axillary nerve distribution.  The patient is on the cardiac monitor to evaluate for evidence of arrhythmia and/or significant heart rate changes.   DIFFERENTIAL DIAGNOSIS (includes but not limited to):   Shoulder dislocation, fracture, sprain, abrasion, axillary nerve injury   PLAN: We will perform sedation and reduction urgently given concern for axillary nerve dysfunction.  Patient has an abrasion to his chin.  We will update his tetanus vaccine.  No other sign of injury on exam.   MEDICATIONS GIVEN IN ED: Medications  ondansetron (ZOFRAN) injection 4 mg (4 mg Intravenous Given 12/24/21 0243)  fentaNYL (SUBLIMAZE) injection 50 mcg (50 mcg Intravenous Given 12/24/21 0243)  HYDROmorphone (DILAUDID) injection 1 mg (1 mg  Intravenous Given 12/24/21 0357)  sodium chloride 0.9 % bolus 1,000 mL (1,000 mLs Intravenous New Bag/Given 12/24/21 0357)  propofol (DIPRIVAN) 10 mg/mL bolus/IV push 76.2 mg (76.2 mg Intravenous Given 12/24/21 0405)  Tdap (BOOSTRIX) injection 0.5 mL (0.5 mLs Intramuscular Given 12/24/21 0416)     ED COURSE: Shoulder was easily reduced using propofol sedation, abduction with gentle traction.  Patient now more awake and alert.  He still states that he has sensory nerve deficit over the lateral left deltoid area but seems to have normal motor function of the deltoid and tricep although exam limited as he is in a shoulder immobilizer.  Unable to test function of the teres minor but he is able to lie flat on the bed and his shoulder lies flat against the surface of the bed.  Discussed with Dr. Sabra Heck on-call for orthopedics who recommends follow-up in 5 to 7 days.  He reports that this sensory deficit can take weeks to resolve and there would not be anything that needs to be done emergently for this.  Patient verbalized understanding.  Will discharge in a shoulder immobilizer and with a prescription of pain medication.  Advised him to keep his arm in the sling and immobilizer at all times until he is cleared by orthopedics.  He has strong pulses distally and normal capillary refill.  Normal sensation throughout the left arm other than over the left deltoid area.  No other sign of injury other than the small abrasion to his right shin but no bony tenderness there.  Compartments are soft.  Will provide with work note for light duty for the next 2 weeks given patient works in [employer].  Family here to take him home.   At this time, I do not feel there is any life-threatening condition present. I reviewed all nursing notes, vitals, pertinent previous records.  All lab and urine results, EKGs, imaging ordered have been independently reviewed and interpreted by myself.  I reviewed all available radiology reports  from any imaging ordered this visit.  Based on my assessment, I feel the patient is safe to be discharged home without further emergent workup and can continue workup as an outpatient as needed. Discussed all findings, treatment plan as well as usual and customary return precautions with patient and significant other.  They verbalize understanding and are comfortable with this plan.  Outpatient follow-up has been provided as needed.  All questions have been answered.    CONSULTS: Dr. Sabra Heck with orthopedics consulted.   OUTSIDE RECORDS REVIEWED: Reviewed patient's last office visit with Cherlyn Cushing on 12/10/2021.         FINAL CLINICAL IMPRESSION(S) / ED DIAGNOSES   Final diagnoses:  Anterior  shoulder dislocation, left, initial encounter     Rx / DC Orders   ED Discharge Orders          Ordered    HYDROcodone-acetaminophen (NORCO/VICODIN) 5-325 MG tablet  Every 6 hours PRN        12/24/21 0513    ondansetron (ZOFRAN-ODT) 4 MG disintegrating tablet  Every 6 hours PRN        12/24/21 0513    ibuprofen (ADVIL) 800 MG tablet  Every 8 hours PRN        12/24/21 0513             Note:  This document was prepared using Dragon voice recognition software and may include unintentional dictation errors.   Mathew Postiglione, Delice Bison, DO 12/24/21 [phone]

## 2022-10-11 IMAGING — DX DG SHOULDER 1V*L*
3 series · 3 of 3 positions shown · non-contrast
Comparison: Presented with anterior glenohumeral dislocation with
comparison to the plain films today at [DATE] a.m.

CLINICAL DATA: Dislocation reduction views.

EXAM:
LEFT SHOULDER

[shoulder ap]
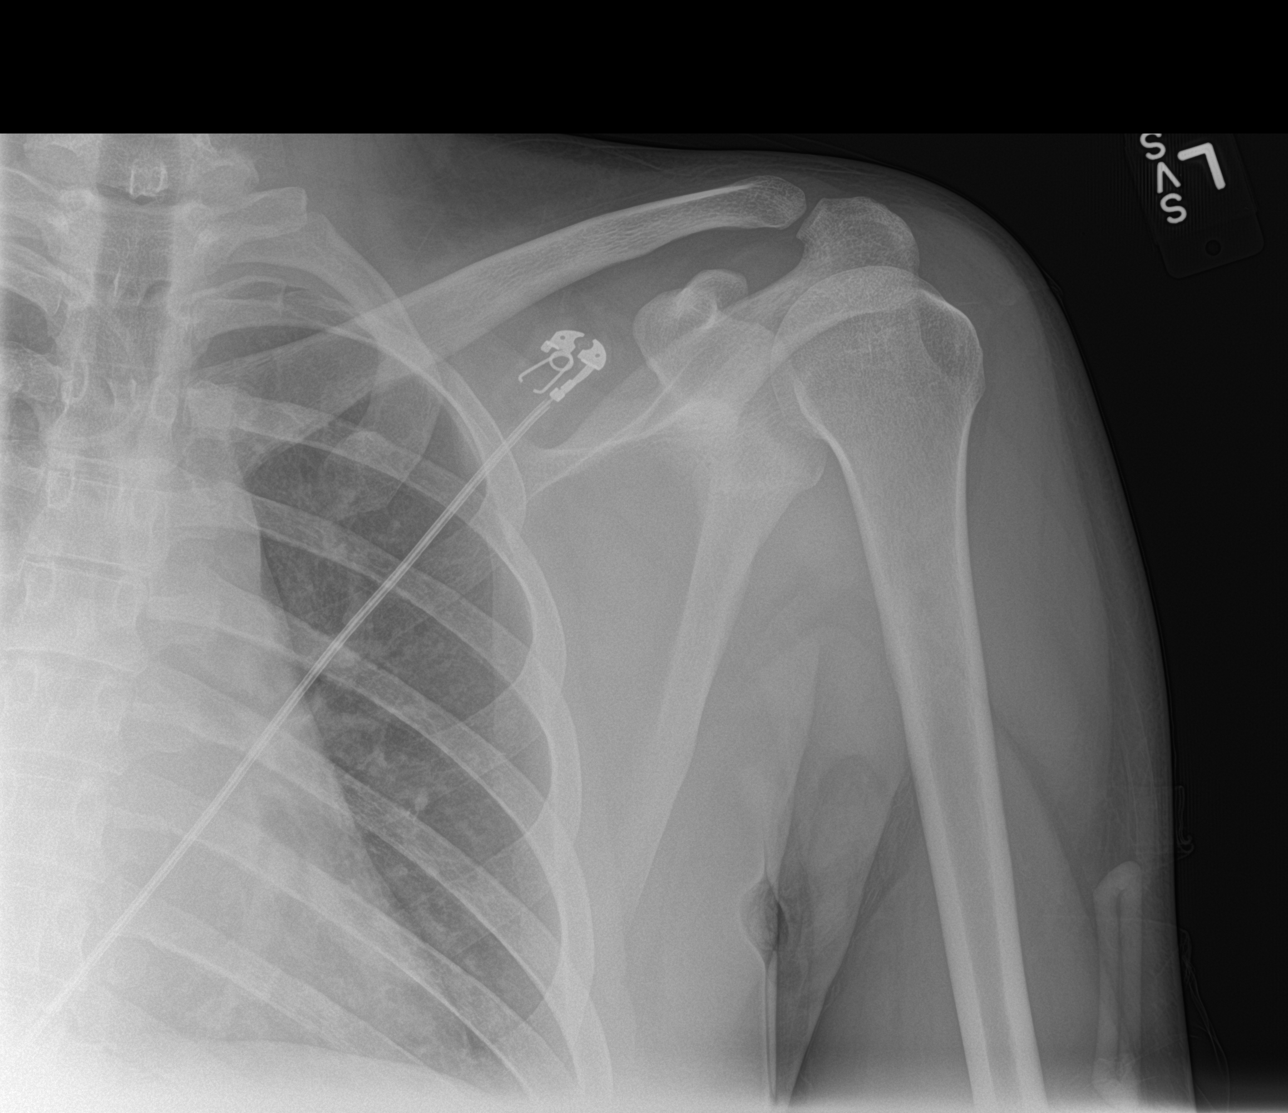

[shoulder obl (1 of 2)]
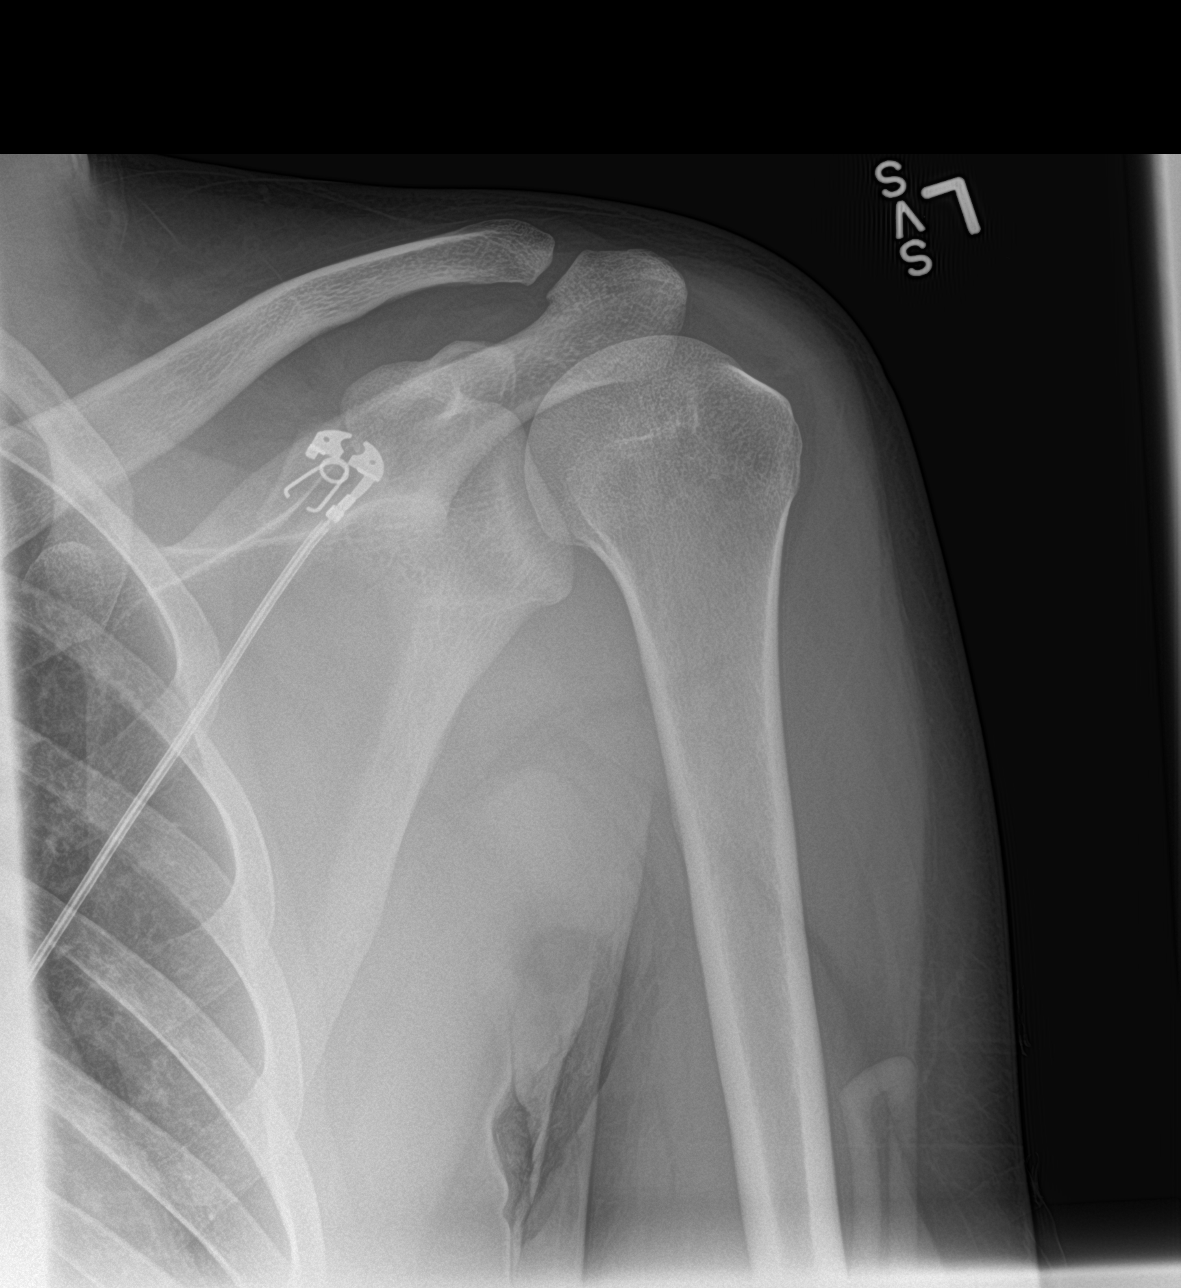

[shoulder obl (2 of 2)]
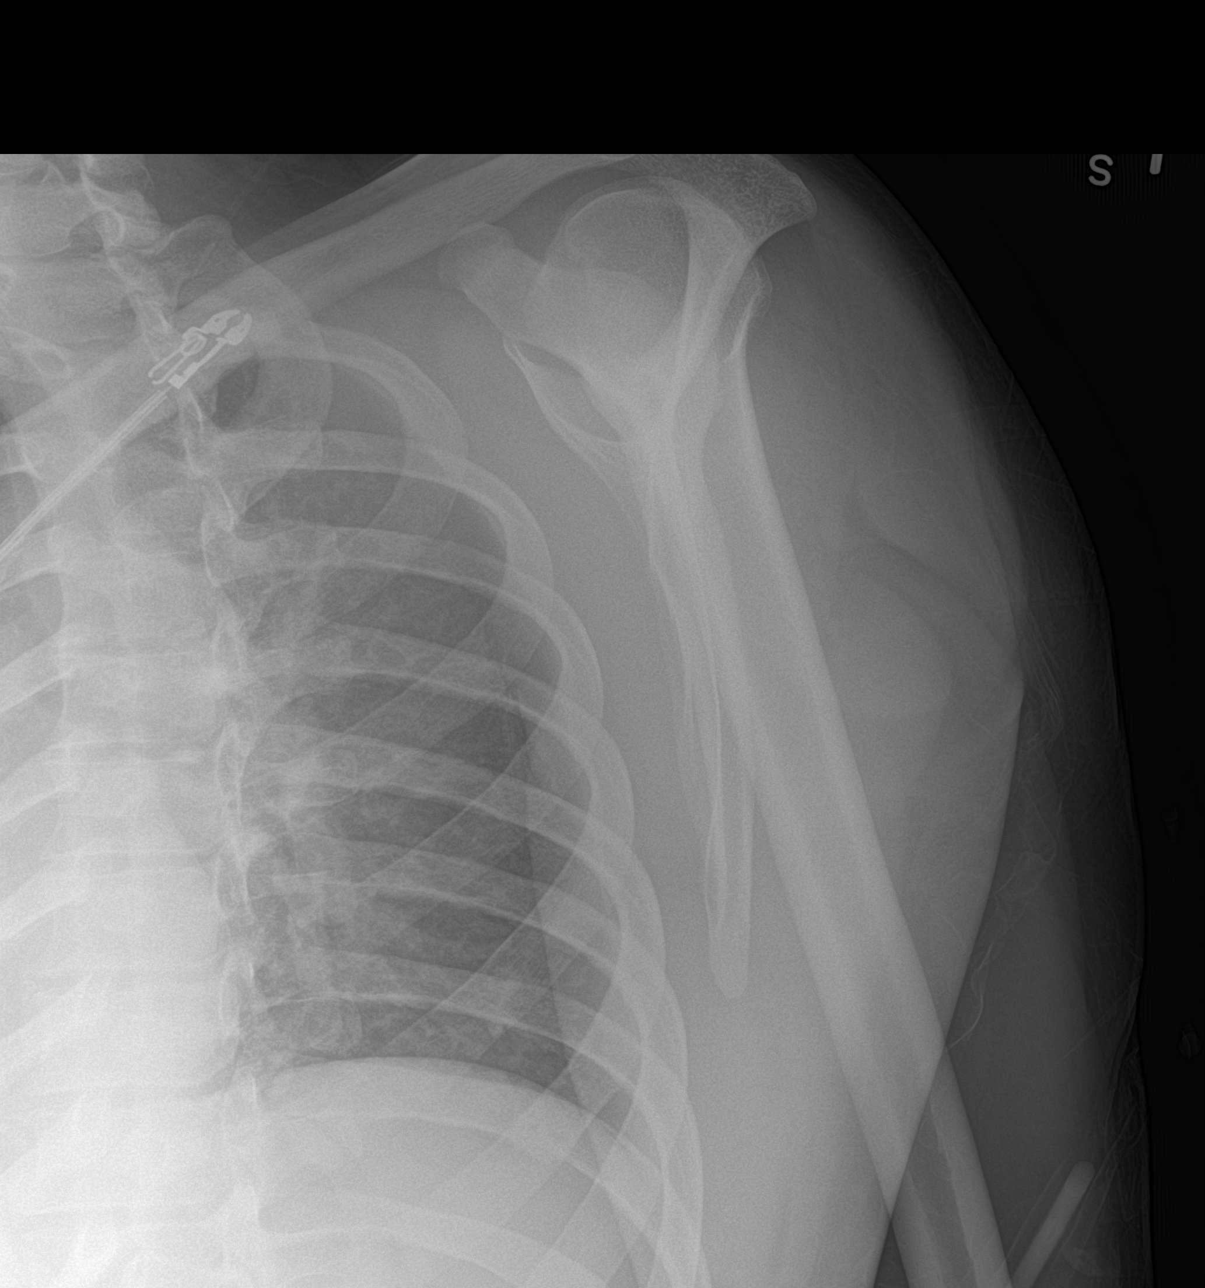

[3 of 3 positions shown; findings below may reference images not displayed]

FINDINGS: Successful interval relocation of the humeral head to the glenoid
fossa. There is a Hill-Sachs wedge impaction deformity of the
superolateral humeral head. No fracture is evident. The AC joint is
normal.
IMPRESSION: Successful left shoulder dislocation reduction with Hill-Sachs
deformity of the superolateral humeral head.

## 2022-10-11 IMAGING — DX DG SHOULDER 2+V*L*
3 series · 3 of 3 positions shown · non-contrast
Comparison: None Available.

CLINICAL DATA: Fall and trauma to the left shoulder.

EXAM:
LEFT SHOULDER - 2+ VIEW

[shoulder swimmer]
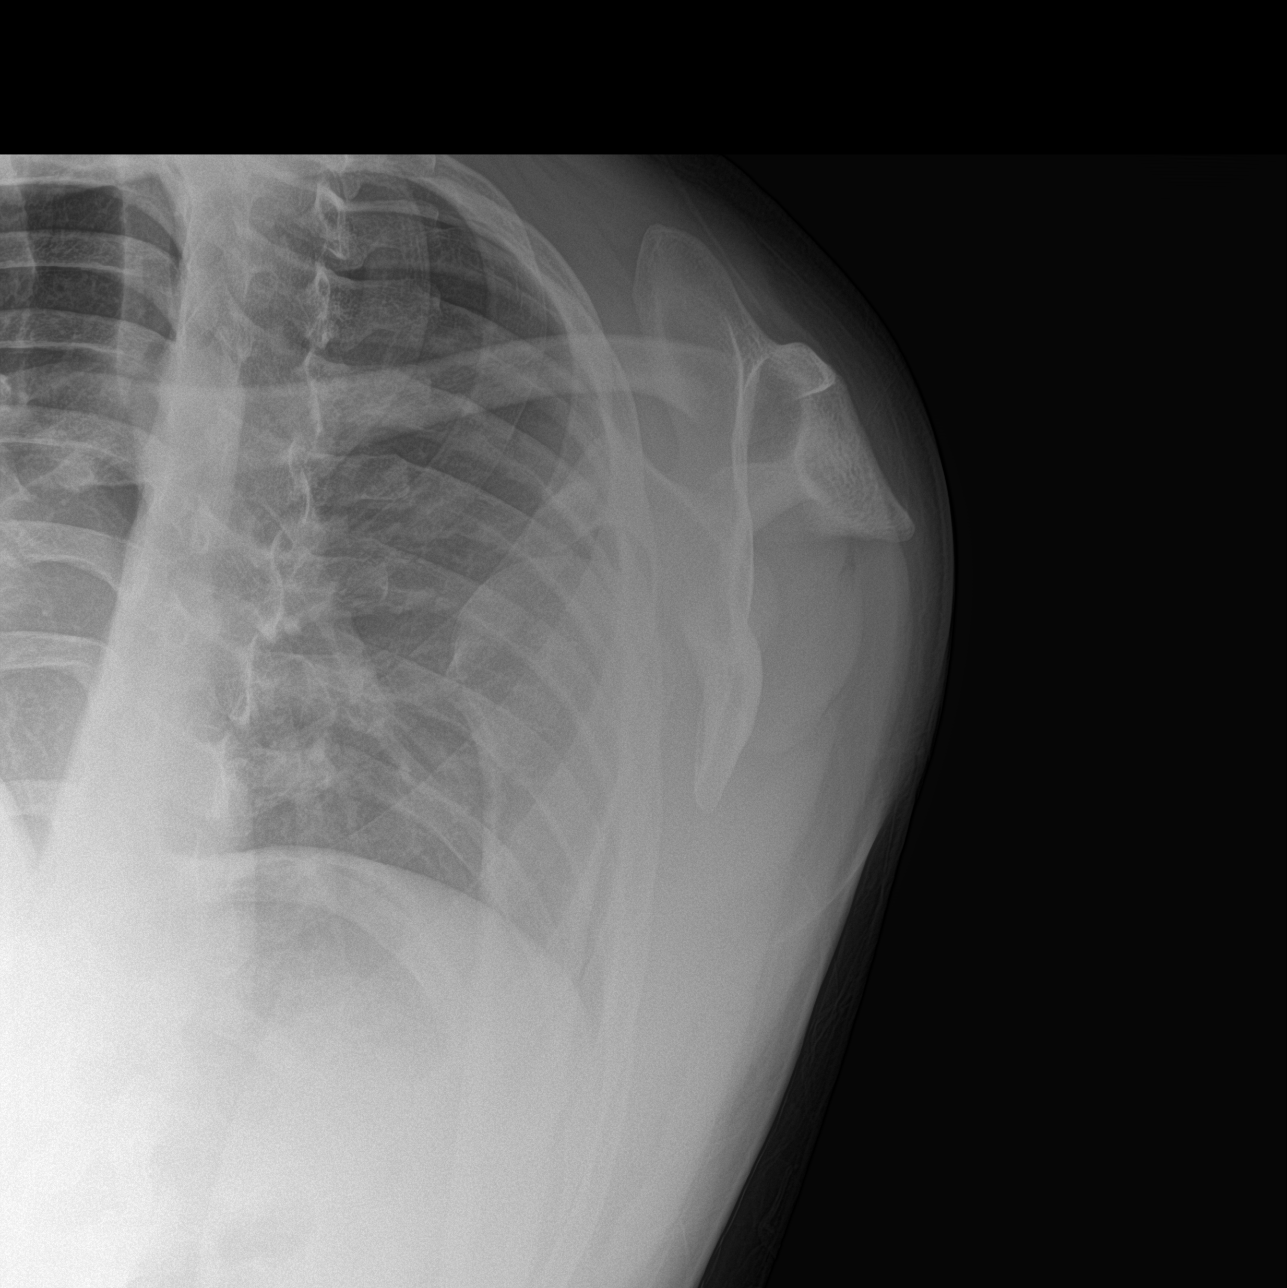

[shoulder ap]
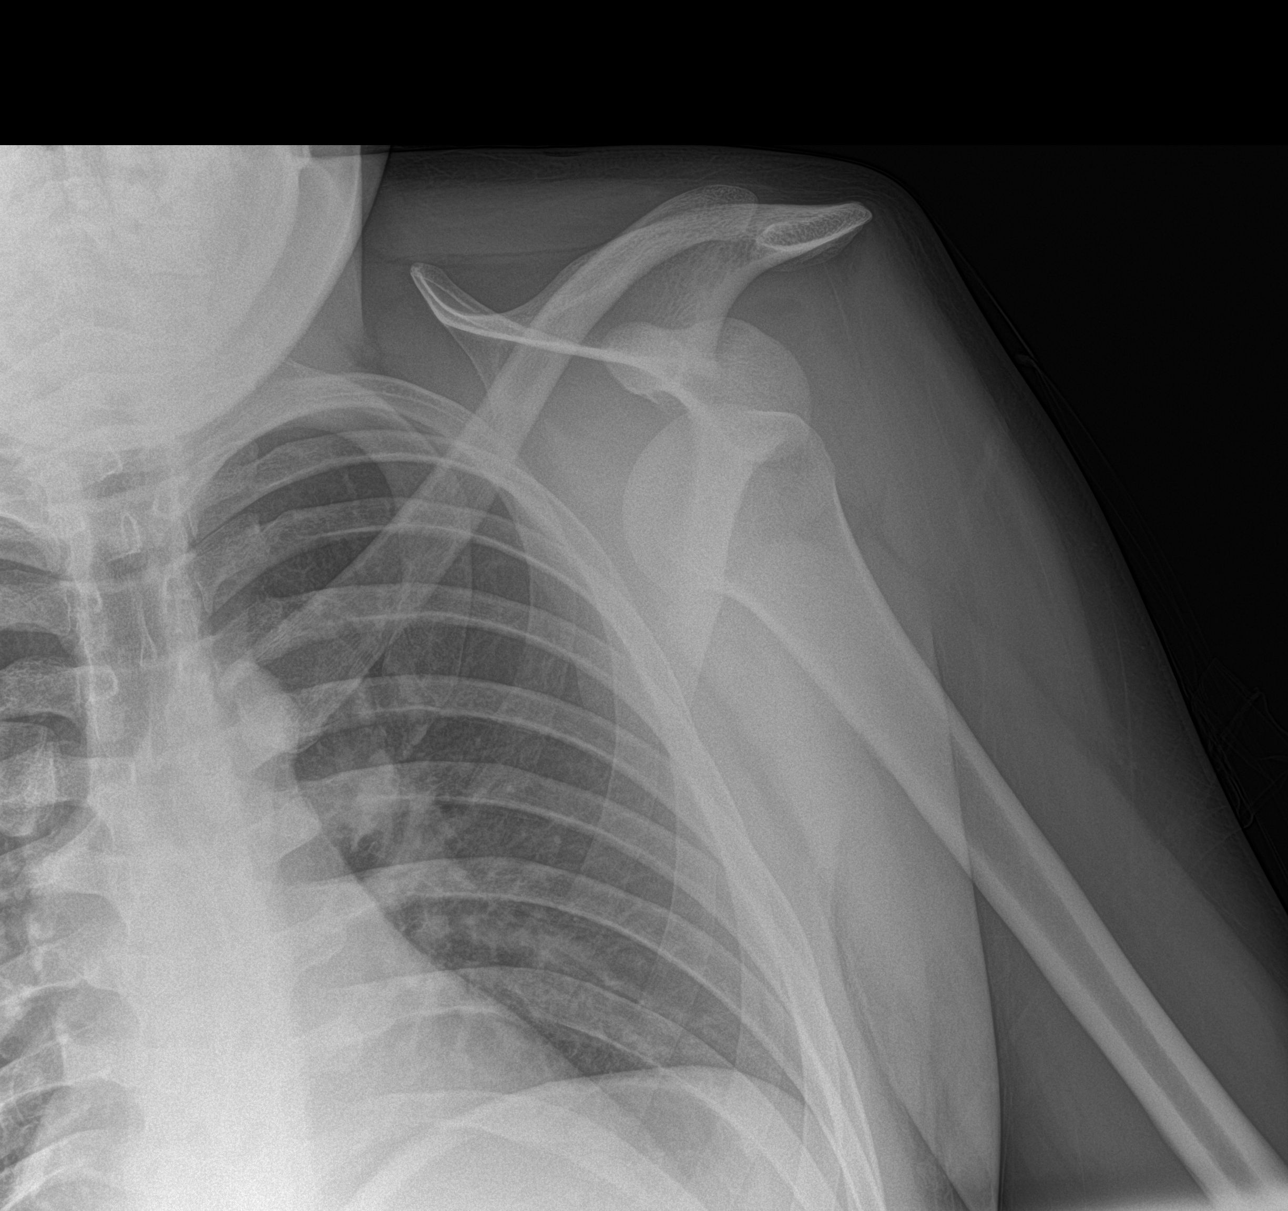

[shoulder obl]
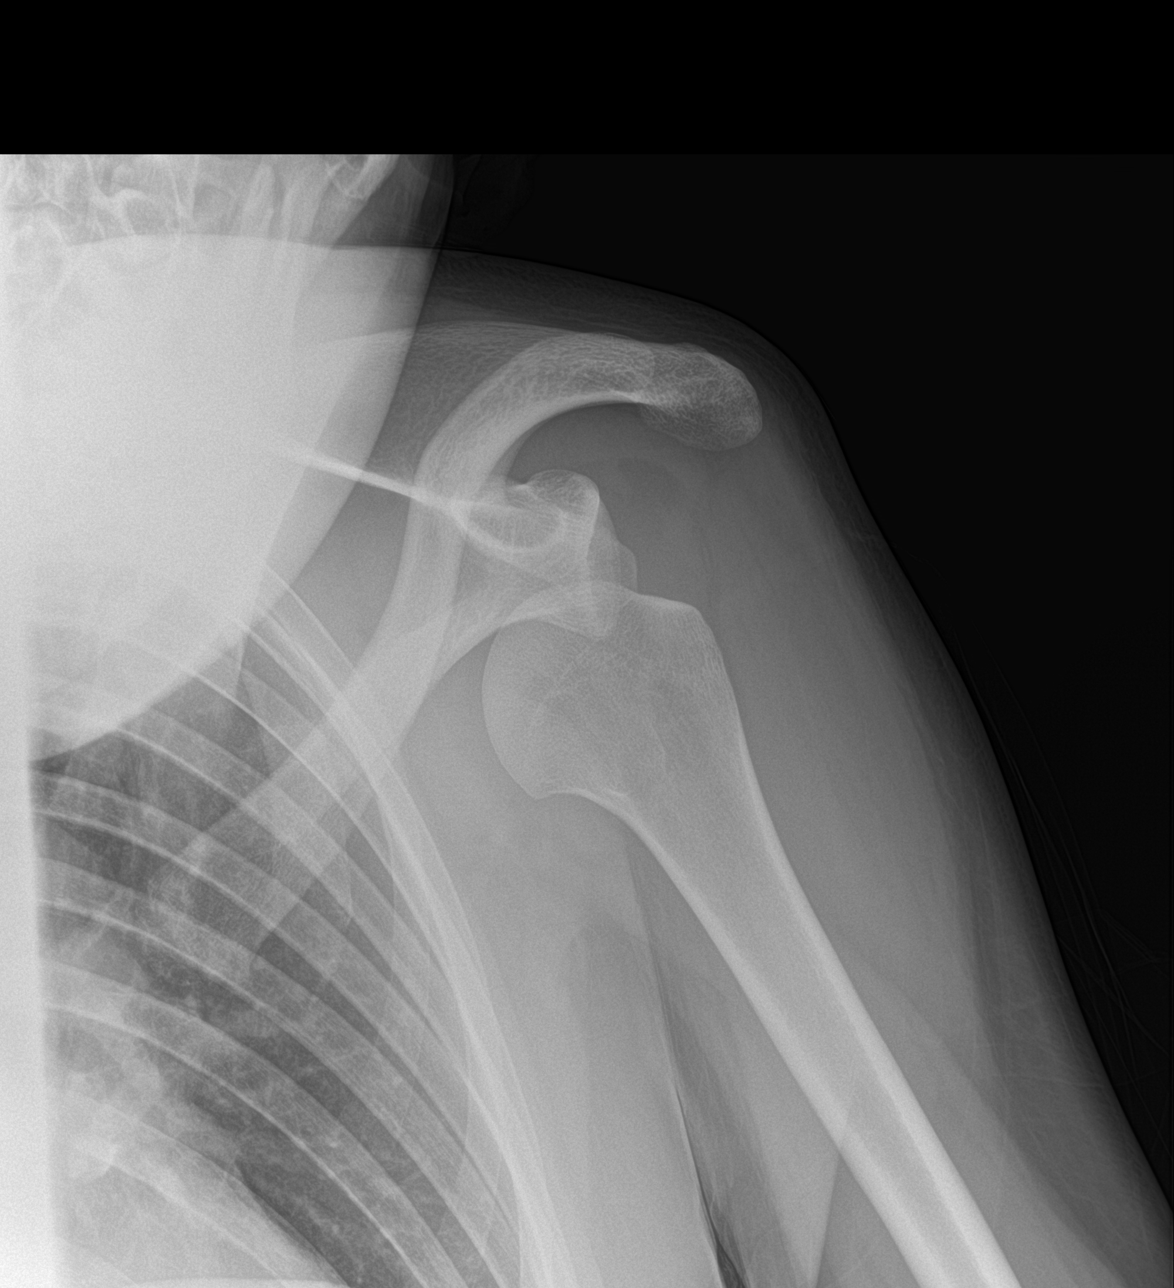

[3 of 3 positions shown; findings below may reference images not displayed]

FINDINGS: Anterior dislocation of the left shoulder. No definite acute
fracture. The bones are well mineralized. The soft tissues are
unremarkable.
IMPRESSION: Anterior dislocation of the left shoulder.
